# Patient Record
Sex: Male | Born: 1990 | Race: White | Hispanic: No | Marital: Single
Health system: Southern US, Community
[De-identification: ages and names within clinical notes are randomized; demographics above are authoritative.]

---

## 2019-08-08 DIAGNOSIS — I517 Cardiomegaly: Secondary | ICD-10-CM

## 2019-08-08 DIAGNOSIS — L03116 Cellulitis of left lower limb: Secondary | ICD-10-CM

## 2019-08-08 DIAGNOSIS — F1190 Opioid use, unspecified, uncomplicated: Secondary | ICD-10-CM

## 2019-08-08 DIAGNOSIS — D72829 Elevated white blood cell count, unspecified: Secondary | ICD-10-CM

## 2019-08-08 DIAGNOSIS — L03221 Cellulitis of neck: Secondary | ICD-10-CM

## 2021-01-13 ENCOUNTER — Emergency Department (HOSPITAL_COMMUNITY): Payer: Self-pay

## 2021-01-13 ENCOUNTER — Encounter (HOSPITAL_COMMUNITY): Payer: Self-pay | Admitting: *Deleted

## 2021-01-13 ENCOUNTER — Other Ambulatory Visit: Payer: Self-pay

## 2021-01-13 ENCOUNTER — Inpatient Hospital Stay (HOSPITAL_COMMUNITY)
Admission: EM | Admit: 2021-01-13 | Discharge: 2021-01-16 | DRG: 917 | Disposition: A | Discharge status: Home / Self Care | Payer: Self-pay | Attending: Internal Medicine | Admitting: Internal Medicine

## 2021-01-13 DIAGNOSIS — F1910 Other psychoactive substance abuse, uncomplicated: Secondary | ICD-10-CM | POA: Diagnosis present

## 2021-01-13 DIAGNOSIS — T40411A Poisoning by fentanyl or fentanyl analogs, accidental (unintentional), initial encounter: Secondary | ICD-10-CM | POA: Diagnosis present

## 2021-01-13 DIAGNOSIS — R0602 Shortness of breath: Secondary | ICD-10-CM

## 2021-01-13 DIAGNOSIS — E162 Hypoglycemia, unspecified: Secondary | ICD-10-CM | POA: Diagnosis present

## 2021-01-13 DIAGNOSIS — N179 Acute kidney failure, unspecified: Secondary | ICD-10-CM | POA: Diagnosis present

## 2021-01-13 DIAGNOSIS — G928 Other toxic encephalopathy: Secondary | ICD-10-CM | POA: Diagnosis present

## 2021-01-13 DIAGNOSIS — E870 Hyperosmolality and hypernatremia: Secondary | ICD-10-CM | POA: Diagnosis present

## 2021-01-13 DIAGNOSIS — U071 COVID-19: Secondary | ICD-10-CM | POA: Diagnosis present

## 2021-01-13 DIAGNOSIS — T50901A Poisoning by unspecified drugs, medicaments and biological substances, accidental (unintentional), initial encounter: Secondary | ICD-10-CM

## 2021-01-13 DIAGNOSIS — E872 Acidosis: Secondary | ICD-10-CM | POA: Diagnosis present

## 2021-01-13 DIAGNOSIS — R739 Hyperglycemia, unspecified: Secondary | ICD-10-CM | POA: Diagnosis present

## 2021-01-13 DIAGNOSIS — J9601 Acute respiratory failure with hypoxia: Secondary | ICD-10-CM | POA: Diagnosis present

## 2021-01-13 DIAGNOSIS — T401X1A Poisoning by heroin, accidental (unintentional), initial encounter: Principal | ICD-10-CM | POA: Diagnosis present

## 2021-01-13 DIAGNOSIS — R569 Unspecified convulsions: Secondary | ICD-10-CM | POA: Diagnosis present

## 2021-01-13 DIAGNOSIS — G929 Unspecified toxic encephalopathy: Secondary | ICD-10-CM | POA: Diagnosis present

## 2021-01-13 LAB — I-STAT ARTERIAL BLOOD GAS, ED
Acid-Base Excess: 0 mmol/L (ref 0.0–2.0)
Bicarbonate: 25.2 mmol/L (ref 20.0–28.0)
Calcium, Ion: 1.19 mmol/L (ref 1.15–1.40)
HCT: 39 % (ref 39.0–52.0)
Hemoglobin: 13.3 g/dL (ref 13.0–17.0)
O2 Saturation: 100 %
Patient temperature: 98.9
Potassium: 4.2 mmol/L (ref 3.5–5.1)
Sodium: 144 mmol/L (ref 135–145)
TCO2: 26 mmol/L (ref 22–32)
pCO2 arterial: 41.3 mmHg (ref 32.0–48.0)
pH, Arterial: 7.394 (ref 7.350–7.450)
pO2, Arterial: 530 mmHg — ABNORMAL HIGH (ref 83.0–108.0)

## 2021-01-13 LAB — I-STAT CHEM 8, ED
BUN: 19 mg/dL (ref 6–20)
Calcium, Ion: 1.26 mmol/L (ref 1.15–1.40)
Chloride: 109 mmol/L (ref 98–111)
Creatinine, Ser: 1 mg/dL (ref 0.61–1.24)
Glucose, Bld: 224 mg/dL — ABNORMAL HIGH (ref 70–99)
HCT: 50 % (ref 39.0–52.0)
Hemoglobin: 17 g/dL (ref 13.0–17.0)
Potassium: 5.1 mmol/L (ref 3.5–5.1)
Sodium: 147 mmol/L — ABNORMAL HIGH (ref 135–145)
TCO2: 20 mmol/L — ABNORMAL LOW (ref 22–32)

## 2021-01-13 LAB — PROCALCITONIN: Procalcitonin: <0.1 ng/mL

## 2021-01-13 LAB — HIV ANTIBODY (ROUTINE TESTING W REFLEX): HIV Screen 4th Generation wRfx: NONREACTIVE

## 2021-01-13 LAB — CBC WITH DIFFERENTIAL/PLATELET
Abs Immature Granulocytes: 0.04 10*3/uL (ref 0.00–0.07)
Basophils Absolute: 0 10*3/uL (ref 0.0–0.1)
Basophils Relative: 0 %
Eosinophils Absolute: 0 10*3/uL (ref 0.0–0.5)
Eosinophils Relative: 0 %
HCT: 41.7 % (ref 39.0–52.0)
Hemoglobin: 13.5 g/dL (ref 13.0–17.0)
Immature Granulocytes: 0 %
Lymphocytes Relative: 6 %
Lymphs Abs: 0.7 10*3/uL (ref 0.7–4.0)
MCH: 27.4 pg (ref 26.0–34.0)
MCHC: 32.4 g/dL (ref 30.0–36.0)
MCV: 84.6 fL (ref 80.0–100.0)
Monocytes Absolute: 0.7 10*3/uL (ref 0.1–1.0)
Monocytes Relative: 6 %
Neutro Abs: 10 10*3/uL — ABNORMAL HIGH (ref 1.7–7.7)
Neutrophils Relative %: 88 %
Platelets: 231 10*3/uL (ref 150–400)
RBC: 4.93 MIL/uL (ref 4.22–5.81)
RDW: 13.6 % (ref 11.5–15.5)
WBC: 11.4 10*3/uL — ABNORMAL HIGH (ref 4.0–10.5)
nRBC: 0 % (ref 0.0–0.2)

## 2021-01-13 LAB — COMPREHENSIVE METABOLIC PANEL
ALT: 24 U/L (ref 0–44)
AST: 42 U/L — ABNORMAL HIGH (ref 15–41)
Albumin: 5 g/dL (ref 3.5–5.0)
Alkaline Phosphatase: 74 U/L (ref 38–126)
Anion gap: 24 — ABNORMAL HIGH (ref 5–15)
BUN: 16 mg/dL (ref 6–20)
CO2: 19 mmol/L — ABNORMAL LOW (ref 22–32)
Calcium: 10.2 mg/dL (ref 8.9–10.3)
Chloride: 106 mmol/L (ref 98–111)
Creatinine, Ser: 1.27 mg/dL — ABNORMAL HIGH (ref 0.61–1.24)
GFR, Estimated: >60 mL/min (ref >60)
Glucose, Bld: 240 mg/dL — ABNORMAL HIGH (ref 70–99)
Potassium: 5.1 mmol/L (ref 3.5–5.1)
Sodium: 149 mmol/L — ABNORMAL HIGH (ref 135–145)
Total Bilirubin: 3 mg/dL — ABNORMAL HIGH (ref 0.3–1.2)
Total Protein: 7.7 g/dL (ref 6.5–8.1)

## 2021-01-13 LAB — RAPID URINE DRUG SCREEN, HOSP PERFORMED
Amphetamines: NOT DETECTED
Barbiturates: NOT DETECTED
Benzodiazepines: NOT DETECTED
Cocaine: NOT DETECTED
Opiates: NOT DETECTED
Tetrahydrocannabinol: NOT DETECTED

## 2021-01-13 LAB — URINALYSIS, MICROSCOPIC (REFLEX)
RBC / HPF: >50 RBC/hpf (ref 0–5)
Squamous Epithelial / HPF: NONE SEEN (ref 0–5)

## 2021-01-13 LAB — URINALYSIS, ROUTINE W REFLEX MICROSCOPIC
Bilirubin Urine: NEGATIVE
Glucose, UA: NEGATIVE mg/dL
Ketones, ur: NEGATIVE mg/dL
Leukocytes,Ua: NEGATIVE
Nitrite: NEGATIVE
Protein, ur: 30 mg/dL — AB
Specific Gravity, Urine: >1.03 — ABNORMAL HIGH (ref 1.005–1.030)
pH: 6 (ref 5.0–8.0)

## 2021-01-13 LAB — MRSA PCR SCREENING: MRSA by PCR: NEGATIVE

## 2021-01-13 LAB — MAGNESIUM: Magnesium: 2.3 mg/dL (ref 1.7–2.4)

## 2021-01-13 LAB — LACTIC ACID, PLASMA
Lactic Acid, Venous: >11 mmol/L (ref 0.5–1.9)
Lactic Acid, Venous: 1.6 mmol/L (ref 0.5–1.9)

## 2021-01-13 LAB — SALICYLATE LEVEL: Salicylate Lvl: <7 mg/dL — ABNORMAL LOW (ref 7.0–30.0)

## 2021-01-13 LAB — ACETAMINOPHEN LEVEL: Acetaminophen (Tylenol), Serum: <10 ug/mL — ABNORMAL LOW (ref 10–30)

## 2021-01-13 LAB — RESP PANEL BY RT-PCR (FLU A&B, COVID) ARPGX2
Influenza A by PCR: NEGATIVE
Influenza B by PCR: NEGATIVE
SARS Coronavirus 2 by RT PCR: POSITIVE — AB

## 2021-01-13 LAB — GLUCOSE, CAPILLARY
Glucose-Capillary: 60 mg/dL — ABNORMAL LOW (ref 70–99)
Glucose-Capillary: 91 mg/dL (ref 70–99)
Glucose-Capillary: 94 mg/dL (ref 70–99)

## 2021-01-13 LAB — PHOSPHORUS: Phosphorus: 1.2 mg/dL — ABNORMAL LOW (ref 2.5–4.6)

## 2021-01-13 LAB — HEMOGLOBIN A1C
Hgb A1c MFr Bld: 4.5 % — ABNORMAL LOW (ref 4.8–5.6)
Mean Plasma Glucose: 82.45 mg/dL

## 2021-01-13 LAB — ETHANOL: Alcohol, Ethyl (B): <10 mg/dL (ref <10)

## 2021-01-13 MED ORDER — SODIUM CHLORIDE 0.9 % IV BOLUS
1000.0000 mL | Freq: Once | INTRAVENOUS | Status: AC
  Administered 2021-01-13: 1000 mL via INTRAVENOUS

## 2021-01-13 MED ORDER — SODIUM CHLORIDE 0.9 % IV SOLN: INTRAVENOUS | Status: DC

## 2021-01-13 MED ORDER — PROPOFOL 1000 MG/100ML IV EMUL
0.0000 ug/kg/min | INTRAVENOUS | Status: DC
  Administered 2021-01-13 – 2021-01-14 (×2): 20 ug/kg/min via INTRAVENOUS
  Filled 2021-01-13 (×3): qty 100

## 2021-01-13 MED ORDER — DOCUSATE SODIUM 50 MG/5ML PO LIQD
100.0000 mg | Freq: Two times a day (BID) | ORAL | Status: DC | PRN
  Filled 2021-01-13: qty 10

## 2021-01-13 MED ORDER — THIAMINE HCL 100 MG/ML IJ SOLN
100.0000 mg | Freq: Every day | INTRAMUSCULAR | Status: DC
  Administered 2021-01-13 – 2021-01-16 (×4): 100 mg via INTRAVENOUS
  Filled 2021-01-13 (×4): qty 2

## 2021-01-13 MED ORDER — POLYETHYLENE GLYCOL 3350 17 G PO PACK
17.0000 g | PACK | Freq: Every day | ORAL | Status: DC
  Administered 2021-01-13 – 2021-01-14 (×2): 17 g

## 2021-01-13 MED ORDER — ORAL CARE MOUTH RINSE
15.0000 mL | OROMUCOSAL | Status: DC
  Administered 2021-01-13 – 2021-01-14 (×8): 15 mL via OROMUCOSAL

## 2021-01-13 MED ORDER — FOLIC ACID 5 MG/ML IJ SOLN
1.0000 mg | Freq: Every day | INTRAMUSCULAR | Status: DC
  Administered 2021-01-13 – 2021-01-14 (×2): 1 mg via INTRAVENOUS
  Filled 2021-01-13 (×5): qty 0.2

## 2021-01-13 MED ORDER — ADULT MULTIVITAMIN W/MINERALS CH
1.0000 | ORAL_TABLET | Freq: Every day | ORAL | Status: DC
  Administered 2021-01-14 – 2021-01-16 (×3): 1
  Filled 2021-01-13 (×3): qty 1

## 2021-01-13 MED ORDER — FENTANYL CITRATE (PF) 100 MCG/2ML IJ SOLN
50.0000 ug | INTRAMUSCULAR | Status: DC | PRN
  Administered 2021-01-14 (×2): 100 ug via INTRAVENOUS
  Filled 2021-01-13: qty 2

## 2021-01-13 MED ORDER — DEXMEDETOMIDINE HCL IN NACL 400 MCG/100ML IV SOLN
0.4000 ug/kg/h | INTRAVENOUS | Status: DC
  Filled 2021-01-13: qty 100

## 2021-01-13 MED ORDER — POLYETHYLENE GLYCOL 3350 17 G PO PACK: 17.0000 g | PACK | Freq: Every day | ORAL | Status: DC | PRN

## 2021-01-13 MED ORDER — FAMOTIDINE IN NACL 20-0.9 MG/50ML-% IV SOLN
20.0000 mg | Freq: Two times a day (BID) | INTRAVENOUS | Status: DC
  Administered 2021-01-13 – 2021-01-16 (×6): 20 mg via INTRAVENOUS
  Filled 2021-01-13 (×8): qty 50

## 2021-01-13 MED ORDER — LORAZEPAM 2 MG/ML IJ SOLN: 2.0000 mg | Freq: Once | INTRAMUSCULAR | Status: DC | PRN

## 2021-01-13 MED ORDER — PROPOFOL 1000 MG/100ML IV EMUL
5.0000 ug/kg/min | INTRAVENOUS | Status: DC
  Administered 2021-01-13: 10 ug/kg/min via INTRAVENOUS

## 2021-01-13 MED ORDER — LACTATED RINGERS IV BOLUS
1000.0000 mL | Freq: Once | INTRAVENOUS | Status: AC
  Administered 2021-01-13: 1000 mL via INTRAVENOUS

## 2021-01-13 MED ORDER — DEXTROSE 50 % IV SOLN
1.0000 | Freq: Once | INTRAVENOUS | Status: AC
  Administered 2021-01-13: 50 mL via INTRAVENOUS

## 2021-01-13 MED ORDER — LORAZEPAM 2 MG/ML IJ SOLN
2.0000 mg | Freq: Once | INTRAMUSCULAR | Status: AC
  Administered 2021-01-13: 2 mg via INTRAVENOUS

## 2021-01-13 MED ORDER — FENTANYL CITRATE (PF) 100 MCG/2ML IJ SOLN
50.0000 ug | INTRAMUSCULAR | Status: DC | PRN
  Filled 2021-01-13 (×2): qty 2

## 2021-01-13 MED ORDER — CHLORHEXIDINE GLUCONATE 0.12% ORAL RINSE (MEDLINE KIT)
15.0000 mL | Freq: Two times a day (BID) | OROMUCOSAL | Status: DC
  Administered 2021-01-13 – 2021-01-14 (×2): 15 mL via OROMUCOSAL

## 2021-01-13 MED ORDER — DEXTROSE 50 % IV SOLN
INTRAVENOUS | Status: AC
  Filled 2021-01-13: qty 50

## 2021-01-13 MED ORDER — DOCUSATE SODIUM 100 MG PO CAPS: 100.0000 mg | ORAL_CAPSULE | Freq: Two times a day (BID) | ORAL | Status: DC | PRN

## 2021-01-13 MED ORDER — ALBUTEROL SULFATE (2.5 MG/3ML) 0.083% IN NEBU: 2.5000 mg | INHALATION_SOLUTION | RESPIRATORY_TRACT | Status: DC | PRN

## 2021-01-13 MED ORDER — DOCUSATE SODIUM 50 MG/5ML PO LIQD
100.0000 mg | Freq: Two times a day (BID) | ORAL | Status: DC
  Administered 2021-01-13 – 2021-01-15 (×5): 100 mg
  Filled 2021-01-13 (×5): qty 10

## 2021-01-13 MED ORDER — HEPARIN SODIUM (PORCINE) 5000 UNIT/ML IJ SOLN
5000.0000 [IU] | Freq: Three times a day (TID) | INTRAMUSCULAR | Status: DC
  Administered 2021-01-13 – 2021-01-15 (×6): 5000 [IU] via SUBCUTANEOUS
  Filled 2021-01-13 (×6): qty 1

## 2021-01-13 MED ORDER — INSULIN ASPART 100 UNIT/ML ~~LOC~~ SOLN: 0.0000 [IU] | SUBCUTANEOUS | Status: DC

## 2021-01-13 MED ORDER — LACTATED RINGERS IV BOLUS: 2000.0000 mL | Freq: Once | INTRAVENOUS | Status: DC

## 2021-01-13 MED ORDER — LORAZEPAM 2 MG/ML IJ SOLN
INTRAMUSCULAR | Status: AC
  Administered 2021-01-13: 2 mg
  Filled 2021-01-13: qty 1

## 2021-01-13 NOTE — ED Notes (Signed)
Patient transported to CT 

## 2021-01-13 NOTE — H&P (Addendum)
NAME:  James Li, MRN:  [id number], DOB:  01-Jun-1991, LOS: 0 ADMISSION DATE:  01/13/2021, CONSULTATION DATE:  01/13/2021 REFERRING MD:  Dr. Particia Nearing, CHIEF COMPLAINT:  OD  Brief History:  89 yoM with prior history of polysubstance abuse who had been clean for 6 months but relapsed and reportedly accidentally overdosed on heroin.  Not responsive to multiple doses of narcan and questionable seizure activity requiring intubation in ER.  PCCM called for admit.   History of Present Illness:  Obtained from medical chart review as patient is continued sedated on mechanical ventilation.  30 year old male with history of polysubstance abuse, reportedly clean for the last 6 months,  presenting from hotel room with altered mental status after heroin use.  Girlfriend called EMS and gave narcan.  EMS found patient agonal, and responsive to 4 mg of Narcan, and required BVM bagging in route to ER.  On arrival to ER, patient more hypoxic wiith questionable seizure activity was given additional Ativan and Narcan without improvement and therefore intubated for airway protection.  Labs noted for negative UDS, ETOH, salicylate and Tylenol level, Na 149, K 5.1, glucose 240, sCr 1.27, AST 42, lactic acid > 11, WBC 11.4, CXR non acute, lines stable and CTH negative.  Found to be incidentally Covid positive. PCCM called for admit.   Past Medical History:  Polysubstance/ IVDA  Significant Hospital Events:  2/13 admitted/ intubated  Consults:    Procedures:  ETT 2/13 >>  Significant Diagnostic Tests:  2/13 CTH >> Brain parenchyma appears unremarkable. No mass or hemorrhage. There is mucosal thickening in several ethmoid air cells.  Micro Data:  2/13 SARS >> positive  Flu>> neg  Antimicrobials:  n/a  Interim History / Subjective:    Objective   Blood pressure (!) 97/56, pulse 77, temperature 98.8 F (37.1 C), resp. rate 16, height 6' (1.829 m), weight 99.8 kg, SpO2 100 %.    Vent  Mode: PRVC FiO2 (%):  [100 %] 100 % Set Rate:  [16 bmp] 16 bmp Vt Set:  [299 mL] 620 mL PEEP:  [5 cmH20] 5 cmH20 Plateau Pressure:  [15 cmH20] 15 cmH20   Intake/Output Summary (Last 24 hours) at 01/13/2021 1548 Last data filed at 01/13/2021 1531 Gross per 24 hour  Intake 11.77 ml  Output --  Net 11.77 ml   Filed Weights   01/13/21 1400 01/13/21 1414  Weight: 99.8 kg 99.8 kg    Examination: on propofol 10 mcg/kg/min General:  Young adult male intubated on MV in NAD HEENT: MM pink/moist, ETT/ OGT, pupils 2/ sluggish, small abrasion over right eyebrow Neuro: sedated, not f/c, withdrawals to pain to noxious stimuli CV: rr. NSR, no murmur PULM:  Non labored, CTA GI: soft, +bs, foley - cyu  Extremities: warm/dry, no edema  Skin: no rashes, several healed scars to chest, tattoos, no splinter hemorrhages/ janeway lesions  Resolved Hospital Problem list    Assessment & Plan:   Accidental heroin overdose Possible seizure activity, suspected 2/2 to hypoxia  Neg ETOH, UDS, tylenol and ASA levels on admit CTH neg - supportive care  - EEG - seizure precautions/ ativan prn seizure - empiric thiamine/ folate/ MVI - serial neuro exams - correct metabolic derangements  - once extubated will need to confirm w/pt accidental OD, rule out SI - checking HIV/ RPR - illicit drug abuse counseling/ CSW when appropriate  - Maintain neuro protective measures; goal for eurothermia, euglycemia, eunatermia, normoxia, and PCO2 goal of 35-40  Acute respiratory insufficiency -Continue MV  support, 4-8cc/kg IBW with goal Pplat <30 and DP<15  -VAP prevention protocol/ PPI -PAD protocol for sedation> d/c propofol, change to precedex, prn fentanyl -wean FiO2 as able for SpO2 >92%  -daily SAT & SBT when mental status improves  - intermittent CXR/ ABG  Lactic acidosis AGMA Hypernatremia AKI - s/p 2L NS in ER, additional 1LR now - trend lactic- suspected due to seizure - Trend BMP / mag/ phos/   urinary output- continue foley for now, d/c when mental status improves - Replace electrolytes as indicated - Avoid nephrotoxic agents, ensure adequate renal perfusion  Incidental COVID positive - supportive care - airborne/ contact precautions - no steroids (not hypoxic currently down to 40%)   Hyperglycemia  - CBG q 4/ SSI sensitive   Leukocytosis - likely just reactive, aspiration possible but CXR clear  - pan-cultured, UA pending, checking PCT - monitor clinically for now  Best practice (evaluated daily)  Diet: NPO Pain/Anxiety/Delirium protocol (if indicated): precedex/ prn fentanyl  VAP protocol (if indicated): yes DVT prophylaxis: SCDs, heparin SQ GI prophylaxis: pepcid  Glucose control: CBG/ SSI Mobility: BR Disposition: admit to ICU  Goals of Care:  Last date of multidisciplinary goals of care discussion: unable Family and staff present: unable Summary of discussion: unable Follow up goals of care discussion due: unable Code Status: Full   No family listed.  Will place CSW consult.    Labs   CBC: Recent Labs  Lab 01/13/21 1411 01/13/21 1534 01/13/21 1539  WBC  --  11.4*  --   NEUTROABS  --  10.0*  --   HGB 17.0 13.5 13.3  HCT 50.0 41.7 39.0  MCV  --  84.6  --   PLT  --  231  --     Basic Metabolic Panel: Recent Labs  Lab 01/13/21 1409 01/13/21 1411 01/13/21 1539  NA 149* 147* 144  K 5.1 5.1 4.2  CL 106 109  --   CO2 19*  --   --   GLUCOSE 240* 224*  --   BUN 16 19  --   CREATININE 1.27* 1.00  --   CALCIUM 10.2  --   --    GFR: Estimated Creatinine Clearance: 135.8 mL/min (by C-G formula based on SCr of 1 mg/dL). Recent Labs  Lab 01/13/21 1410 01/13/21 1534  WBC  --  11.4*  LATICACIDVEN >11.0*  --     Liver Function Tests: Recent Labs  Lab 01/13/21 1409  AST 42*  ALT 24  ALKPHOS 74  BILITOT 3.0*  PROT 7.7  ALBUMIN 5.0   No results for input(s): LIPASE, AMYLASE in the last 168 hours. No results for input(s): AMMONIA in the  last 168 hours.  ABG    Component Value Date/Time   PHART 7.394 01/13/2021 1539   PCO2ART 41.3 01/13/2021 1539   PO2ART 530 (H) 01/13/2021 1539   HCO3 25.2 01/13/2021 1539   TCO2 26 01/13/2021 1539   O2SAT 100.0 01/13/2021 1539     Coagulation Profile: No results for input(s): INR, PROTIME in the last 168 hours.  Cardiac Enzymes: No results for input(s): CKTOTAL, CKMB, CKMBINDEX, TROPONINI in the last 168 hours.  HbA1C: No results found for: HGBA1C  CBG: No results for input(s): GLUCAP in the last 168 hours.  Review of Systems:   Unable   Past Medical History:  He,  has no past medical history on file.   Surgical History:  unable  Social History:   reports current drug use. Drug: IV.  Family History:  His family history is not on file.   Allergies Not on File   Home Medications  Prior to Admission medications   Not on File     Critical care time: 55 mins     Posey Boyer, ACNP [facility] 01/13/2021, 5:18 PM

## 2021-01-13 NOTE — ED Triage Notes (Signed)
Patient presents to ed via [facility] was called to hotel room by patients girlfriend, states patient had been clean for 6 months , patient was unresp, upon ems arrival with agonal resp. Patient had NPA and was being bagged. Was given Narcan 4 mg IV per ems upon arrival to treatment room patient started seizing. DR Particia Nearing at bedside resp at bedside .

## 2021-01-13 NOTE — ED Notes (Signed)
Patient was given Narcan 2 mg IV  1402 Ativan 2 mg IV  1403 Etomidate 20 mg and Succ. 100 mg given intubated with 71/2  24 @the  lip #16 OG inserted . 

## 2021-01-13 NOTE — Progress Notes (Signed)
Assisted MD with intubation.  Pt tolerated well.  SP{O2 98% ETT 7.5/24 lip.  ABG to follow RT to monitor

## 2021-01-13 NOTE — ED Provider Notes (Signed)
INTUBATION Performed by: Linwood Dibbles  Required items: required blood products, implants, devices, and special equipment available Patient identity confirmed: provided demographic data and hospital-assigned identification number Time out: Immediately prior to procedure a "time out" was called to verify the correct patient, procedure, equipment, support staff and site/side marked as required.  Indications: Respiratory failure, Overdose  Intubation method: Glidescope Laryngoscopy   Preoxygenation: BVM  Sedatives: Etomidate Paralytic: Succinylcholine  Tube Size: 7.5 cuffed  Post-procedure assessment: chest rise and ETCO2 monitor Breath sounds: equal and absent over the epigastrium Tube secured with: ETT holder  Chest x-ray interpreted by radiologist and me.  Chest x-ray findings: endotracheal tube in appropriate position  Patient tolerated the procedure well with no immediate complications.     James Li A, PA-C 01/13/21 1441    James Lefevre, MD 01/13/21 1459

## 2021-01-13 NOTE — ED Provider Notes (Signed)
[facility] EMERGENCY DEPARTMENT Provider Note   CSN: [id number] Arrival date & time: 01/13/21  1402     History Chief Complaint  Patient presents with  . Drug Overdose    James Li is a 30 y.o. male.  Pt presents to the ED today as a drug overdose.  Pt has a hx of polysubstance abuse, but has been apparently clean for 6 months.  This afternoon, he injected heroin and became unresponsive.  His girlfriend was with him and called EMS.  Pt was having agonal respirations.  He was given 4 mg narcan IV en route and required continuous bvm.  Pt appeared to have seizure activity upon arrival to the room.  Pt unable to give any hx.       Pmhx:  Polysubstance abuse   No family history on file.  Social History   Tobacco Use  . Smoking status: Unknown If Ever Smoked  Substance Use Topics  . Drug use: Yes    Types: IV    Comment: herion    Home Medications Prior to Admission medications   Not on File    Allergies    Patient has no allergy information on record.  Review of Systems   Review of Systems  Unable to perform ROS: Patient unresponsive    Physical Exam Updated Vital Signs BP (!) 97/56   Pulse 77   Temp 98.8 F (37.1 C)   Resp 16   Ht 6' (1.829 m)   Wt 99.8 kg   SpO2 100%   BMI 29.84 kg/m   Physical Exam Vitals and nursing note reviewed.  Constitutional:      General: He is in acute distress.     Appearance: He is ill-appearing and toxic-appearing.  HENT:     Head: Normocephalic and atraumatic.     Comments: npa in place    Right Ear: External ear normal.     Left Ear: External ear normal.     Mouth/Throat:     Mouth: Mucous membranes are dry.  Eyes:     Conjunctiva/sclera: Conjunctivae normal.  Cardiovascular:     Rate and Rhythm: Regular rhythm. Tachycardia present.     Pulses: Normal pulses.     Heart sounds: Normal heart sounds.  Pulmonary:     Comments: Some spontaneous respirations.  BMV. Abdominal:      General: Abdomen is flat. Bowel sounds are normal.     Palpations: Abdomen is soft.  Musculoskeletal:        General: No deformity.     Cervical back: Normal range of motion and neck supple.  Skin:    General: Skin is warm.     Capillary Refill: Capillary refill takes less than 2 seconds.  Neurological:     Mental Status: He is unresponsive.     Comments: Seizure like activity  Psychiatric:     Comments: Unable to assess     ED Results / Procedures / Treatments   Labs (all labs ordered are listed, but only abnormal results are displayed) Labs Reviewed  RESP PANEL BY RT-PCR (FLU A&B, COVID) ARPGX2 - Abnormal; Notable for the following components:      Result Value   SARS Coronavirus 2 by RT PCR POSITIVE (*)    All other components within normal limits  COMPREHENSIVE METABOLIC PANEL - Abnormal; Notable for the following components:   Sodium 149 (*)    CO2 19 (*)    Glucose, Bld 240 (*)    Creatinine,  Ser 1.27 (*)    AST 42 (*)    Total Bilirubin 3.0 (*)    Anion gap 24 (*)    All other components within normal limits  SALICYLATE LEVEL - Abnormal; Notable for the following components:   Salicylate Lvl <7.0 (*)    All other components within normal limits  ACETAMINOPHEN LEVEL - Abnormal; Notable for the following components:   Acetaminophen (Tylenol), Serum <10 (*)    All other components within normal limits  LACTIC ACID, PLASMA - Abnormal; Notable for the following components:   Lactic Acid, Venous >11.0 (*)    All other components within normal limits  CBC WITH DIFFERENTIAL/PLATELET - Abnormal; Notable for the following components:   WBC 11.4 (*)    Neutro Abs 10.0 (*)    All other components within normal limits  I-STAT CHEM 8, ED - Abnormal; Notable for the following components:   Sodium 147 (*)    Glucose, Bld 224 (*)    TCO2 20 (*)    All other components within normal limits  I-STAT ARTERIAL BLOOD GAS, ED - Abnormal; Notable for the following components:    pO2, Arterial 530 (*)    All other components within normal limits  ETHANOL  RAPID URINE DRUG SCREEN, HOSP PERFORMED  CBC WITH DIFFERENTIAL/PLATELET  LACTIC ACID, PLASMA  BLOOD GAS, ARTERIAL  CBG MONITORING, ED    EKG None  Radiology CT Head Wo Contrast  Result Date: 01/13/2021 CLINICAL DATA:  Seizure EXAM: CT HEAD WITHOUT CONTRAST TECHNIQUE: Contiguous axial images were obtained from the base of the skull through the vertex without intravenous contrast. COMPARISON:  None. FINDINGS: Brain: Ventricles and sulci are normal in size and configuration. There is no intracranial mass, hemorrhage, extra-axial fluid collection, or midline shift. The brain parenchyma appears unremarkable. There is no evident acute infarct. Vascular: There is no hyperdense vessel. No evident vascular calcification. Skull: Bony calvarium appears intact. Sinuses/Orbits: Mucosal thickening noted in several ethmoid air cells. Orbits appear symmetric bilaterally. Other: Mastoid air cells are clear. IMPRESSION: Brain parenchyma appears unremarkable. No mass or hemorrhage. There is mucosal thickening in several ethmoid air cells. Electronically Signed   By: Bretta Bang III M.D.   On: 01/13/2021 15:37   DG Chest Portable 1 View  Result Date: 01/13/2021 CLINICAL DATA:  Status post intubation. EXAM: PORTABLE CHEST 1 VIEW COMPARISON:  Chest x-ray dated January 04, 2017. FINDINGS: Endotracheal tube tip 4.7 cm above the carina. Enteric tube in the stomach with the tip in the gastric fundus. The heart size and mediastinal contours are within normal limits. Normal pulmonary vascularity. No focal consolidation, pleural effusion, or pneumothorax. No acute osseous abnormality. IMPRESSION: 1. Appropriately positioned endotracheal and enteric tubes. 2. No active disease. Electronically Signed   By: Obie Dredge M.D.   On: 01/13/2021 14:36    Procedures Procedures   Medications Ordered in ED Medications  propofol (DIPRIVAN)  1000 MG/100ML infusion (15 mcg/kg/min  99.8 kg Intravenous Infusion Verify 01/13/21 1531)  sodium chloride 0.9 % bolus 1,000 mL (0 mLs Intravenous Stopped 01/13/21 1558)    And  0.9 %  sodium chloride infusion ( Intravenous New Bag/Given 01/13/21 1437)  LORazepam (ATIVAN) injection 2 mg (2 mg Intravenous Given 01/13/21 1416)  LORazepam (ATIVAN) 2 MG/ML injection (2 mg  Given 01/13/21 1435)  sodium chloride 0.9 % bolus 1,000 mL (0 mLs Intravenous Stopped 01/13/21 1558)    ED Course  I have reviewed the triage vital signs and the nursing notes.  Pertinent labs &  imaging results that were available during my care of the patient were reviewed by me and considered in my medical decision making (see chart for details).    MDM Rules/Calculators/A&P                          Pt given an additional 2 mg narcan IV and 2 mg of ativan IV.  Seizure appeared to stop, but pt is still minimally responsive.  Decision made to intubate.  Pt intubated without difficulty.  O2 sat did drop briefly during intubation, but it came right back up.   Pt d/w CCM for admission.  Pt is also incidentally positive for Covid.  CRITICAL CARE Performed by: Jacalyn Lefevre   Total critical care time: 45 minutes  Critical care time was exclusive of separately billable procedures and treating other patients.  Critical care was necessary to treat or prevent imminent or life-threatening deterioration.  Critical care was time spent personally by me on the following activities: development of treatment plan with patient and/or surrogate as well as nursing, discussions with consultants, evaluation of patient's response to treatment, examination of patient, obtaining history from patient or surrogate, ordering and performing treatments and interventions, ordering and review of laboratory studies, ordering and review of radiographic studies, pulse oximetry and re-evaluation of patient's condition.   Final Clinical Impression(s) /  ED Diagnoses Final diagnoses:  Accidental overdose of heroin, initial encounter (HCC)  Acute respiratory failure with hypoxia (HCC)  COVID-19    Rx / DC Orders ED Discharge Orders    None       Jacalyn Lefevre, MD 01/13/21 1559

## 2021-01-14 DIAGNOSIS — T401X1A Poisoning by heroin, accidental (unintentional), initial encounter: Principal | ICD-10-CM

## 2021-01-14 DIAGNOSIS — G929 Unspecified toxic encephalopathy: Secondary | ICD-10-CM

## 2021-01-14 DIAGNOSIS — U071 COVID-19: Secondary | ICD-10-CM

## 2021-01-14 LAB — BASIC METABOLIC PANEL
Anion gap: 8 (ref 5–15)
BUN: 14 mg/dL (ref 6–20)
CO2: 21 mmol/L — ABNORMAL LOW (ref 22–32)
Calcium: 8 mg/dL — ABNORMAL LOW (ref 8.9–10.3)
Chloride: 113 mmol/L — ABNORMAL HIGH (ref 98–111)
Creatinine, Ser: 0.96 mg/dL (ref 0.61–1.24)
GFR, Estimated: >60 mL/min (ref >60)
Glucose, Bld: 101 mg/dL — ABNORMAL HIGH (ref 70–99)
Potassium: 3.3 mmol/L — ABNORMAL LOW (ref 3.5–5.1)
Sodium: 142 mmol/L (ref 135–145)

## 2021-01-14 LAB — CBC
HCT: 35.6 % — ABNORMAL LOW (ref 39.0–52.0)
Hemoglobin: 12.3 g/dL — ABNORMAL LOW (ref 13.0–17.0)
MCH: 28.7 pg (ref 26.0–34.0)
MCHC: 34.6 g/dL (ref 30.0–36.0)
MCV: 83.2 fL (ref 80.0–100.0)
Platelets: 181 10*3/uL (ref 150–400)
RBC: 4.28 MIL/uL (ref 4.22–5.81)
RDW: 14 % (ref 11.5–15.5)
WBC: 7 10*3/uL (ref 4.0–10.5)
nRBC: 0 % (ref 0.0–0.2)

## 2021-01-14 LAB — PHOSPHORUS: Phosphorus: 2.8 mg/dL (ref 2.5–4.6)

## 2021-01-14 LAB — HEPATIC FUNCTION PANEL
ALT: 18 U/L (ref 0–44)
AST: 32 U/L (ref 15–41)
Albumin: 3.3 g/dL — ABNORMAL LOW (ref 3.5–5.0)
Alkaline Phosphatase: 46 U/L (ref 38–126)
Bilirubin, Direct: 0.2 mg/dL (ref 0.0–0.2)
Indirect Bilirubin: 3 mg/dL — ABNORMAL HIGH (ref 0.3–0.9)
Total Bilirubin: 3.2 mg/dL — ABNORMAL HIGH (ref 0.3–1.2)
Total Protein: 5.2 g/dL — ABNORMAL LOW (ref 6.5–8.1)

## 2021-01-14 LAB — GLUCOSE, CAPILLARY
Glucose-Capillary: 103 mg/dL — ABNORMAL HIGH (ref 70–99)
Glucose-Capillary: 125 mg/dL — ABNORMAL HIGH (ref 70–99)
Glucose-Capillary: 160 mg/dL — ABNORMAL HIGH (ref 70–99)
Glucose-Capillary: 224 mg/dL — ABNORMAL HIGH (ref 70–99)
Glucose-Capillary: 58 mg/dL — ABNORMAL LOW (ref 70–99)
Glucose-Capillary: 64 mg/dL — ABNORMAL LOW (ref 70–99)
Glucose-Capillary: 68 mg/dL — ABNORMAL LOW (ref 70–99)
Glucose-Capillary: 82 mg/dL (ref 70–99)
Glucose-Capillary: 83 mg/dL (ref 70–99)

## 2021-01-14 LAB — RPR: RPR Ser Ql: NONREACTIVE

## 2021-01-14 LAB — MAGNESIUM: Magnesium: 1.9 mg/dL (ref 1.7–2.4)

## 2021-01-14 LAB — TRIGLYCERIDES: Triglycerides: 92 mg/dL (ref <150)

## 2021-01-14 MED ORDER — ONDANSETRON HCL 4 MG/2ML IJ SOLN
INTRAMUSCULAR | Status: AC
  Administered 2021-01-14: 4 mg via INTRAVENOUS
  Filled 2021-01-14: qty 2

## 2021-01-14 MED ORDER — NALOXONE HCL 0.4 MG/ML IJ SOLN
INTRAMUSCULAR | Status: AC
  Filled 2021-01-14: qty 1

## 2021-01-14 MED ORDER — NALOXONE HCL 0.4 MG/ML IJ SOLN: 0.1000 mg | INTRAMUSCULAR | Status: DC | PRN

## 2021-01-14 MED ORDER — MELATONIN 5 MG PO TABS
5.0000 mg | ORAL_TABLET | Freq: Once | ORAL | Status: AC
  Administered 2021-01-14: 5 mg via ORAL
  Filled 2021-01-14 (×2): qty 1

## 2021-01-14 MED ORDER — MAGNESIUM SULFATE 2 GM/50ML IV SOLN
2.0000 g | Freq: Once | INTRAVENOUS | Status: AC
  Administered 2021-01-14: 2 g via INTRAVENOUS
  Filled 2021-01-14: qty 50

## 2021-01-14 MED ORDER — NALOXONE HCL 0.4 MG/ML IJ SOLN
0.1000 mg | INTRAMUSCULAR | Status: DC | PRN
  Administered 2021-01-14: 0.4 mg via INTRAVENOUS

## 2021-01-14 MED ORDER — DEXTROSE 50 % IV SOLN: 1.0000 | Freq: Once | INTRAVENOUS | Status: AC

## 2021-01-14 MED ORDER — DEXTROSE 50 % IV SOLN
12.5000 g | INTRAVENOUS | Status: AC
  Administered 2021-01-14: 12.5 g via INTRAVENOUS
  Filled 2021-01-14: qty 50

## 2021-01-14 MED ORDER — IBUPROFEN 400 MG PO TABS
400.0000 mg | ORAL_TABLET | Freq: Once | ORAL | Status: AC
  Administered 2021-01-14: 400 mg via ORAL
  Filled 2021-01-14: qty 1

## 2021-01-14 MED ORDER — INFLUENZA VAC SPLIT QUAD 0.5 ML IM SUSY: 0.5000 mL | PREFILLED_SYRINGE | INTRAMUSCULAR | Status: DC

## 2021-01-14 MED ORDER — POTASSIUM CHLORIDE 10 MEQ/100ML IV SOLN
10.0000 meq | INTRAVENOUS | Status: AC
  Administered 2021-01-14 (×2): 10 meq via INTRAVENOUS
  Filled 2021-01-14 (×2): qty 100

## 2021-01-14 MED ORDER — DEXTROSE 50 % IV SOLN
INTRAVENOUS | Status: AC
  Administered 2021-01-14: 50 mL via INTRAVENOUS
  Filled 2021-01-14: qty 50

## 2021-01-14 MED ORDER — DOCUSATE SODIUM 100 MG PO CAPS
100.0000 mg | ORAL_CAPSULE | Freq: Once | ORAL | Status: AC
  Administered 2021-01-14: 100 mg via ORAL
  Filled 2021-01-14: qty 1

## 2021-01-14 MED ORDER — CHLORHEXIDINE GLUCONATE CLOTH 2 % EX PADS
6.0000 | MEDICATED_PAD | Freq: Every day | CUTANEOUS | Status: DC
  Administered 2021-01-14 – 2021-01-15 (×2): 6 via TOPICAL

## 2021-01-14 MED ORDER — MELATONIN 5 MG PO TABS
5.0000 mg | ORAL_TABLET | Freq: Every evening | ORAL | Status: DC | PRN
  Administered 2021-01-14 – 2021-01-15 (×2): 5 mg via ORAL
  Filled 2021-01-14 (×3): qty 1

## 2021-01-14 MED ORDER — ACETAMINOPHEN 325 MG PO TABS
650.0000 mg | ORAL_TABLET | Freq: Four times a day (QID) | ORAL | Status: DC | PRN
  Administered 2021-01-14 – 2021-01-15 (×2): 650 mg via ORAL
  Filled 2021-01-14 (×2): qty 2

## 2021-01-14 MED ORDER — ONDANSETRON HCL 4 MG/2ML IJ SOLN
4.0000 mg | Freq: Four times a day (QID) | INTRAMUSCULAR | Status: DC | PRN
  Administered 2021-01-14 – 2021-01-15 (×2): 4 mg via INTRAVENOUS
  Filled 2021-01-14 (×2): qty 2

## 2021-01-14 NOTE — Progress Notes (Signed)
eLink Physician-Brief Progress Note Patient Name: James Li DOB: [birth year] MRN: [id number]   Date of Service  01/14/2021  HPI/Events of Note  Patient c/o generalized pain. Patient admitted with accidental Heroin OD. Patient also requests sleep aid.   eICU Interventions  Plan: 1. Motrin 400 mg PO X 1 now.  2. Melatonin 5 mg PO Q HS PRN sleep.      Intervention Category Major Interventions: Other:  Lenell Antu 01/14/2021, 8:53 PM

## 2021-01-14 NOTE — Evaluation (Signed)
Physical Therapy Evaluation Patient Details Name: James Li MRN: [id number] DOB: [birth year] Today's Date: 01/14/2021   History of Present Illness  30 y.o. man with history of drug use. Reportedly none in the last 6 months but used heroin prior to admission. Was given narcan by his girlfriend and by EMS. Had witnessed GTC seizure and was intubated for airway protection. incidentally covid +  Clinical Impression   Pt admitted with above diagnosis.  Pt currently with functional limitations due to the deficits listed below (see PT Problem List). Patient slow to respond, however demonstrates normal strength. Not oriented to time or situation. Anticipate will not need follow-up PT, however will follow acutely to further challenge mobility in functional context and with cognitive challenges.     Follow Up Recommendations No PT follow up    Equipment Recommendations  None recommended by PT    Recommendations for Other Services Speech consult;OT consult     Precautions / Restrictions        Mobility  Bed Mobility Overal bed mobility: Independent             General bed mobility comments: standby for lines only    Transfers Overall transfer level: Needs assistance Equipment used: None Transfers: Sit to/from Stand Sit to Stand: Supervision         General transfer comment: for safety and lines  Ambulation/Gait Ambulation/Gait assistance: Min guard Gait Distance (Feet): 3 Feet Assistive device: None Gait Pattern/deviations: Step-through pattern;Decreased stride length     General Gait Details: bed to chair only  Stairs            Wheelchair Mobility    Modified Rankin (Stroke Patients Only)       Balance Overall balance assessment: No apparent balance deficits (not formally assessed)                                           Pertinent Vitals/Pain Pain Assessment: Faces Faces Pain Scale: No hurt    Home Living                    Additional Comments: pt with weak voice and confused; unable to assess    Prior Function Level of Independence: Independent               Hand Dominance        Extremity/Trunk Assessment   Upper Extremity Assessment Upper Extremity Assessment: Overall WFL for tasks assessed (LUE with edema)    Lower Extremity Assessment Lower Extremity Assessment: Overall WFL for tasks assessed    Cervical / Trunk Assessment Cervical / Trunk Assessment: Normal  Communication   Communication: Expressive difficulties (weak voice post-extubation)  Cognition Arousal/Alertness: Lethargic Behavior During Therapy: Flat affect Overall Cognitive Status: Impaired/Different from baseline Area of Impairment: Orientation;Memory                 Orientation Level: Time;Situation (could not state year (month with cues); surprised when told he overdosed (RN confirmed he's been told multiple times and always is surprised)   Memory: Decreased short-term memory                General Comments General comments (skin integrity, edema, etc.): pt slow to respond--especially after reminded him why he is hospitalized    Exercises     Assessment/Plan    PT Assessment Patient needs continued PT services  PT Problem List Decreased cognition       PT Treatment Interventions Gait training;Cognitive remediation;Patient/family education    PT Goals (Current goals can be found in the Care Plan section)  Acute Rehab PT Goals Patient Stated Goal: unable/unwilling PT Goal Formulation: With patient Time For Goal Achievement: 01/28/21 Potential to Achieve Goals: Good    Frequency Min 4X/week   Barriers to discharge        Co-evaluation               AM-PAC PT "6 Clicks" Mobility  Outcome Measure Help needed turning from your back to your side while in a flat bed without using bedrails?: None Help needed moving from lying on your back to sitting on the side of a  flat bed without using bedrails?: None Help needed moving to and from a bed to a chair (including a wheelchair)?: A Little Help needed standing up from a chair using your arms (e.g., wheelchair or bedside chair)?: None Help needed to walk in hospital room?: A Little Help needed climbing 3-5 steps with a railing? : A Little 6 Click Score: 21    End of Session Equipment Utilized During Treatment: Gait belt Activity Tolerance: Patient tolerated treatment well Patient left: in chair;with call bell/phone within reach Nurse Communication: Mobility status;Other (comment) (decr memory for what he's been told) PT Visit Diagnosis: Other symptoms and signs involving the nervous system (R29.898)    Time: 0938-1829 PT Time Calculation (min) (ACUTE ONLY): 26 min   Charges:   PT Evaluation $PT Eval Low Complexity: 1 Low PT Treatments $Therapeutic Activity: 8-22 mins         James Li, PT Pager [phone]   Zena Amos 01/14/2021, 4:11 PM

## 2021-01-14 NOTE — Procedures (Signed)
Extubation Procedure Note  Patient Details:   Name: James Li DOB: 1991-08-25 MRN: [id number]   Airway Documentation:    Vent end date: 01/14/21 Vent end time: 1010   Evaluation  O2 sats: stable throughout Complications: No apparent complications Patient did tolerate procedure well. Bilateral Breath Sounds: Clear,Diminished   Yes   Pt extubated to 2L [place] per MD order. Positive cuff leak noted prior to extubation. Pt not able to speak or cough directly post extubation. Pt given narcan and improved significantly and was able to speak. Pt encouraged to cough and use [place] to clear secretions.   [name] 01/14/2021, 10:22 AM

## 2021-01-14 NOTE — Progress Notes (Signed)
NAME:  James Li, MRN:  [id number], DOB:  [birth year], LOS: 1 ADMISSION DATE:  01/13/2021, CONSULTATION DATE:  01/13/2021 REFERRING MD:  Dr. Particia Nearing, CHIEF COMPLAINT:  OD  Brief History:  30 yoM with prior history of polysubstance abuse who had been clean for 6 months but relapsed and reportedly accidentally overdosed on heroin.  Not responsive to multiple doses of narcan and questionable seizure activity requiring intubation in ER.  PCCM called for admit.   History of Present Illness:  Obtained from medical chart review as patient is continued sedated on mechanical ventilation.  30 year old male with history of polysubstance abuse, reportedly clean for the last 6 months,  presenting from hotel room with altered mental status after heroin use.  Girlfriend called EMS and gave narcan.  EMS found patient agonal, and responsive to 4 mg of Narcan, and required BVM bagging in route to ER.  On arrival to ER, patient more hypoxic wiith questionable seizure activity was given additional Ativan and Narcan without improvement and therefore intubated for airway protection.  Labs noted for negative UDS, ETOH, salicylate and Tylenol level, Na 149, K 5.1, glucose 240, sCr 1.27, AST 42, lactic acid > 11, WBC 11.4, CXR non acute, lines stable and CTH negative.  Found to be incidentally Covid positive. PCCM called for admit.   Past Medical History:  Polysubstance/ IVDA  Significant Hospital Events:  2/13 admitted/ intubated  Consults:    Procedures:  ETT 2/13 >2/14  Significant Diagnostic Tests:  2/13 CTH >> Brain parenchyma appears unremarkable. No mass or hemorrhage. There is mucosal thickening in several ethmoid air cells.  Micro Data:  2/13 SARS >> positive, Flu>> neg  Antimicrobials:  n/a  Interim History / Subjective:  SBT with Vt 1x emesis prior to extubation   Objective   Blood pressure (!) 96/59, pulse (!) 55, temperature 98.6 F (37 C), temperature source Oral,  resp. rate 16, height 6' (1.829 m), weight 99.8 kg, SpO2 100 %.    Vent Mode: PRVC FiO2 (%):  [40 %-100 %] 40 % Set Rate:  [16 bmp] 16 bmp Vt Set:  [620 mL] 620 mL PEEP:  [5 cmH20] 5 cmH20 Plateau Pressure:  [15 cmH20-17 cmH20] 17 cmH20   Intake/Output Summary (Last 24 hours) at 01/14/2021 0709 Last data filed at 01/14/2021 0400 Gross per 24 hour  Intake 1930.14 ml  Output 525 ml  Net 1405.14 ml   Filed Weights   01/13/21 1400 01/13/21 1414  Weight: 99.8 kg 99.8 kg    Examination:  General:  wdwn young adult M, seated in bed moving.  HEENT: NCAT pink mm ETT secure  Neuro: Awakens spontaneously, moves BUE BLE. Follows commands.  CV: rrr s1s2 no rgm  PULM:  Symmetrical chest expansion, e ven, unlabored. CTAb Mechanically ventilated  GI: soft round ndnt  GU: foley with pink tinged urine  Extremities:  No obvious deformity, no cyanosis or clubbing. BUE soft restraints  Skin: chest with several healed scars. Tattoos. C/d/w no rash   Resolved Hospital Problem list    Hypernatremia AKI AGMA Leukocytosis   Assessment & Plan:   Accidental Fentanyl overdose  Substance abuse disorder Possible seizure activity, suspected 2/2 to hypoxia  Neg ETOH, UDS, tylenol and ASA levels on admit CTH neg P -PRN narcan  - empiric thiamine/ folate/ MVI - serial neuro exams -TOC  -substance cessation counseling  Acute encephalopathy , toxic metabolic  -drug overdose as above -hypoglycemia P -Correct Glu as able -PRN narcan  -delirium precautions  -  correct metabolic derangements   Acute respiratory failure in setting of poor airway protection due to fentanyl overdose  P -Tolerating SBT -Extubate -IS, pulm hygiene    Incidental COVID positive - supportive care - airborne/ contact precautions - no steroids (not hypoxic currently down to 40%)   Hypoglycemia P -PRN correction -hope to advance diet later in day following extubation    Best practice (evaluated daily)  Diet:  NPO Pain/Anxiety/Delirium protocol (if indicated): n/a. PRN narcan  VAP protocol (if indicated): yes DVT prophylaxis: SCDs, heparin SQ GI prophylaxis: pepcid  Glucose control: CBG Mobility: BR Disposition: ICU   Goals of Care:  Last date of multidisciplinary goals of care discussion: -- Family and staff present: unable Summary of discussion: unable Follow up goals of care discussion due: 2/20 Code Status: Full  No family listed.  Pt updated 2/14  Labs   CBC: Recent Labs  Lab 01/13/21 1411 01/13/21 1534 01/13/21 1539 01/14/21 0521  WBC  --  11.4*  --  7.0  NEUTROABS  --  10.0*  --   --   HGB 17.0 13.5 13.3 12.3*  HCT 50.0 41.7 39.0 35.6*  MCV  --  84.6  --  83.2  PLT  --  231  --  181    Basic Metabolic Panel: Recent Labs  Lab 01/13/21 1409 01/13/21 1411 01/13/21 1539 01/13/21 1702 01/14/21 0521  NA 149* 147* 144  --  142  K 5.1 5.1 4.2  --  3.3*  CL 106 109  --   --  113*  CO2 19*  --   --   --  21*  GLUCOSE 240* 224*  --   --  101*  BUN 16 19  --   --  14  CREATININE 1.27* 1.00  --   --  0.96  CALCIUM 10.2  --   --   --  8.0*  MG  --   --   --  2.3 1.9  PHOS  --   --   --  1.2* 2.8   GFR: Estimated Creatinine Clearance: 141.4 mL/min (by C-G formula based on SCr of 0.96 mg/dL). Recent Labs  Lab 01/13/21 1410 01/13/21 1534 01/13/21 1702 01/14/21 0521  PROCALCITON  --   --  <0.10  --   WBC  --  11.4*  --  7.0  LATICACIDVEN >11.0*  --  1.6  --     Liver Function Tests: Recent Labs  Lab 01/13/21 1409  AST 42*  ALT 24  ALKPHOS 74  BILITOT 3.0*  PROT 7.7  ALBUMIN 5.0   No results for input(s): LIPASE, AMYLASE in the last 168 hours. No results for input(s): AMMONIA in the last 168 hours.  ABG    Component Value Date/Time   PHART 7.394 01/13/2021 1539   PCO2ART 41.3 01/13/2021 1539   PO2ART 530 (H) 01/13/2021 1539   HCO3 25.2 01/13/2021 1539   TCO2 26 01/13/2021 1539   O2SAT 100.0 01/13/2021 1539     Coagulation Profile: No results  for input(s): INR, PROTIME in the last 168 hours.  Cardiac Enzymes: No results for input(s): CKTOTAL, CKMB, CKMBINDEX, TROPONINI in the last 168 hours.  HbA1C: Hgb A1c MFr Bld  Date/Time Value Ref Range Status  01/13/2021 05:02 PM 4.5 (L) 4.8 - 5.6 % Final    Comment:    (NOTE) Pre diabetes:          5.7%-6.4%  Diabetes:              >  6.4%  Glycemic control for   <7.0% adults with diabetes     CBG: Recent Labs  Lab 01/13/21 2008 01/14/21 0012 01/14/21 0052 01/14/21 0330 01/14/21 0416  GLUCAP 94 68* 125* 64* 160*    CRITICAL CARE Performed by: Lanier Clam   Total critical care time: 60 minutes  Critical care time was exclusive of separately billable procedures and treating other patients. Critical care was necessary to treat or prevent imminent or life-threatening deterioration.  Critical care was time spent personally by me on the following activities: development of treatment plan with patient and/or surrogate as well as nursing, discussions with consultants, evaluation of patient's response to treatment, examination of patient, obtaining history from patient or surrogate, ordering and performing treatments and interventions, ordering and review of laboratory studies, ordering and review of radiographic studies, pulse oximetry and re-evaluation of patient's condition.   Tessie Fass MSN, AGACNP-BC [facility] [phone] If no answer, [phone] 01/14/2021, 10:56 AM

## 2021-01-14 NOTE — Progress Notes (Signed)
eLink Physician-Brief Progress Note Patient Name: James Li DOB: Jul 27, 1991 MRN: [id number]   Date of Service  01/14/2021  HPI/Events of Note  Patient vomited 400 - 500 mL after getting Colace, Melatonin and Motrin.   eICU Interventions  Plan: 1. Repeat doses of Melatonin, Motrin and Colace.      Intervention Category Major Interventions: Other:  Lenell Antu 01/14/2021, 10:20 PM

## 2021-01-14 NOTE — Progress Notes (Signed)
NUTRITION NOTE RD working remotely.  Patient screened for new vent. Patient was extubated today at ~1010. He does not screen for any other nutrition-related needs/reason. He remains NPO at this time.  Will communicate with RD who typically covers floor in case nutrition-related needs arise.  Please consult RD as needed if nutrition-related needs or concerns arise.       Trenton Gammon, MS, RD, LDN, CNSC Inpatient Clinical Dietitian RD pager # available in [facility]  After hours/weekend pager # available in [facility]

## 2021-01-15 LAB — COMPREHENSIVE METABOLIC PANEL
ALT: 24 U/L (ref 0–44)
AST: 34 U/L (ref 15–41)
Albumin: 3.4 g/dL — ABNORMAL LOW (ref 3.5–5.0)
Alkaline Phosphatase: 50 U/L (ref 38–126)
Anion gap: 8 (ref 5–15)
BUN: 6 mg/dL (ref 6–20)
CO2: 22 mmol/L (ref 22–32)
Calcium: 8.3 mg/dL — ABNORMAL LOW (ref 8.9–10.3)
Chloride: 110 mmol/L (ref 98–111)
Creatinine, Ser: 0.8 mg/dL (ref 0.61–1.24)
GFR, Estimated: >60 mL/min (ref >60)
Glucose, Bld: 91 mg/dL (ref 70–99)
Potassium: 3.5 mmol/L (ref 3.5–5.1)
Sodium: 140 mmol/L (ref 135–145)
Total Bilirubin: 3.4 mg/dL — ABNORMAL HIGH (ref 0.3–1.2)
Total Protein: 5.4 g/dL — ABNORMAL LOW (ref 6.5–8.1)

## 2021-01-15 LAB — CBC
HCT: 36.1 % — ABNORMAL LOW (ref 39.0–52.0)
Hemoglobin: 12.5 g/dL — ABNORMAL LOW (ref 13.0–17.0)
MCH: 29.2 pg (ref 26.0–34.0)
MCHC: 34.6 g/dL (ref 30.0–36.0)
MCV: 84.3 fL (ref 80.0–100.0)
Platelets: 170 10*3/uL (ref 150–400)
RBC: 4.28 MIL/uL (ref 4.22–5.81)
RDW: 13.9 % (ref 11.5–15.5)
WBC: 5.9 10*3/uL (ref 4.0–10.5)
nRBC: 0 % (ref 0.0–0.2)

## 2021-01-15 LAB — GLUCOSE, CAPILLARY
Glucose-Capillary: 105 mg/dL — ABNORMAL HIGH (ref 70–99)
Glucose-Capillary: 112 mg/dL — ABNORMAL HIGH (ref 70–99)
Glucose-Capillary: 75 mg/dL (ref 70–99)
Glucose-Capillary: 77 mg/dL (ref 70–99)
Glucose-Capillary: 80 mg/dL (ref 70–99)
Glucose-Capillary: 87 mg/dL (ref 70–99)
Glucose-Capillary: 93 mg/dL (ref 70–99)

## 2021-01-15 LAB — BRAIN NATRIURETIC PEPTIDE: B Natriuretic Peptide: 105.3 pg/mL — ABNORMAL HIGH (ref 0.0–100.0)

## 2021-01-15 LAB — C-REACTIVE PROTEIN: CRP: 4.7 mg/dL — ABNORMAL HIGH (ref <1.0)

## 2021-01-15 LAB — D-DIMER, QUANTITATIVE: D-Dimer, Quant: 0.58 ug/mL-FEU — ABNORMAL HIGH (ref 0.00–0.50)

## 2021-01-15 LAB — MAGNESIUM: Magnesium: 1.9 mg/dL (ref 1.7–2.4)

## 2021-01-15 LAB — TSH: TSH: 1.391 u[IU]/mL (ref 0.350–4.500)

## 2021-01-15 MED ORDER — SODIUM CHLORIDE 0.9 % IV SOLN: INTRAVENOUS | Status: DC

## 2021-01-15 MED ORDER — POTASSIUM CHLORIDE CRYS ER 20 MEQ PO TBCR
40.0000 meq | EXTENDED_RELEASE_TABLET | Freq: Once | ORAL | Status: AC
  Administered 2021-01-15: 40 meq via ORAL
  Filled 2021-01-15: qty 2

## 2021-01-15 MED ORDER — HALOPERIDOL LACTATE 5 MG/ML IJ SOLN
2.0000 mg | Freq: Four times a day (QID) | INTRAMUSCULAR | Status: DC | PRN
  Administered 2021-01-15 (×2): 2 mg via INTRAVENOUS
  Filled 2021-01-15 (×2): qty 1

## 2021-01-15 MED ORDER — ENOXAPARIN SODIUM 60 MG/0.6ML ~~LOC~~ SOLN
50.0000 mg | SUBCUTANEOUS | Status: DC
  Administered 2021-01-15: 50 mg via SUBCUTANEOUS
  Filled 2021-01-15: qty 0.6

## 2021-01-15 MED ORDER — HALOPERIDOL LACTATE 5 MG/ML IJ SOLN
1.0000 mg | Freq: Once | INTRAMUSCULAR | Status: AC
  Administered 2021-01-15: 1 mg via INTRAVENOUS
  Filled 2021-01-15: qty 1

## 2021-01-15 NOTE — Evaluation (Signed)
Clinical/Bedside Swallow Evaluation Patient Details  Name: James Li MRN: [id number] Date of Birth: Nov 01, 1991  Today's Date: 01/15/2021 Time: SLP Start Time (ACUTE ONLY): 0845 SLP Stop Time (ACUTE ONLY): 0855 SLP Time Calculation (min) (ACUTE ONLY): 10 min  Past Medical History: No past medical history on file. Past Surgical History:  The histories are not reviewed yet. Please review them in the "History" navigator section and refresh this SmartLink. HPI:  30 y.o. man with history of drug use. Reportedly none in the last 6 months but used heroin prior to admission. Was given narcan by his girlfriend and by EMS. Had witnessed GTC seizure and was intubated for airway protection (ETT-2/13-2/14).   Assessment / Plan / Recommendation Clinical Impression  Pts swallow appears within functional limits post short term intubation. Pt currently exhibits some cognitive deficits however per chart review mentation continues to improve. Oral motor exam unremarkable. No overt s/sx of aspiration with any PO. Recommend regular thin liquid diet. No further ST needs identified. SLP Visit Diagnosis: Dysphagia, unspecified (R13.10)    Aspiration Risk  Mild aspiration risk    Diet Recommendation   Regular thin liquids   Medication Administration: Whole meds with liquid    Other  Recommendations Oral Care Recommendations: Oral care BID   Follow up Recommendations None      Frequency and Duration            Prognosis        Swallow Study   General Date of Onset: 01/13/21 HPI: 30 y.o. man with history of drug use. Reportedly none in the last 6 months but used heroin prior to admission. Was given narcan by his girlfriend and by EMS. Had witnessed GTC seizure and was intubated for airway protection (ETT-2/13-2/14). Type of Study: Bedside Swallow Evaluation Previous Swallow Assessment: none on file Diet Prior to this Study: Dysphagia 3 (soft);Thin liquids Temperature Spikes Noted:  No Respiratory Status: Room air History of Recent Intubation: Yes Length of Intubations (days): 1 days Date extubated: 01/14/21 Behavior/Cognition: Alert;Pleasant mood Oral Cavity Assessment: Within Functional Limits Oral Care Completed by SLP: No Oral Cavity - Dentition: Adequate natural dentition Vision: Functional for self-feeding Self-Feeding Abilities: Able to feed self Patient Positioning: Upright in bed Baseline Vocal Quality: Normal Volitional Cough: Strong Volitional Swallow: Able to elicit    Oral/Motor/Sensory Function Overall Oral Motor/Sensory Function: Within functional limits   Ice Chips Ice chips: Within functional limits Presentation: Spoon   Thin Liquid Thin Liquid: Within functional limits Presentation: Cup;Straw    Nectar Thick Nectar Thick Liquid: Not tested   Honey Thick Honey Thick Liquid: Not tested   Puree Puree: Within functional limits Presentation: Self Fed   Solid     Solid: Within functional limits Presentation: Self Fed      Shaivi Rothschild H. MA, CCC-SLP Acute Rehabilitation Services   01/15/2021,9:06 AM

## 2021-01-15 NOTE — TOC Initial Note (Addendum)
Transition of Care [facility]) - Initial/Assessment Note    Patient Details  Name: James Li MRN: [id number] Date of Birth: 02/04/1991  Transition of [facility]) CM/SW Contact:    James Sabal, RN Phone Number: 01/15/2021, 3:34 PM  Clinical Narrative:                James Li w patient over the phone due to COVID precautions. He confirms demographics. He does not have PCP. [facility] added to AVS. We discussed resources for substance abuse and he was agreeable to resources. Resources sent to unit, instructed bedside nurse to provide list to patient. Patient confirms that his James Li will provide transportation. TOC will follow for medication assistance at time of DC.     Expected Discharge Plan: Home/Self Care     Patient Goals and CMS Choice        Expected Discharge Plan and Services Expected Discharge Plan: Home/Self Care In-house Referral: Clinical Social Work Discharge Planning Services: CM Consult                                          Prior Living Arrangements/Services   Lives with:: Other (Comment)                   Activities of Daily Living Home Assistive Devices/Equipment: None ADL Screening (condition at time of admission) Patient's cognitive ability adequate to safely complete daily activities?: No Is the patient deaf or have difficulty hearing?: No Does the patient have difficulty seeing, even when wearing glasses/contacts?: No Does the patient have difficulty concentrating, remembering, or making decisions?: Yes Patient able to express need for assistance with ADLs?: No Does the patient have difficulty dressing or bathing?: No Independently performs ADLs?: Yes (appropriate for developmental age) Does the patient have difficulty walking or climbing stairs?: Yes Weakness of Legs: Both Weakness of Arms/Hands: Both  Permission Sought/Granted                  Emotional Assessment              Admission diagnosis:  Toxic  encephalopathy [G92.9] Acute respiratory failure with hypoxia (HCC) [J96.01] Accidental overdose of heroin, initial encounter (HCC) [T40.1X1A] COVID-19 [U07.1] Patient Active Problem List   Diagnosis Date Noted  . Toxic encephalopathy 01/13/2021   PCP:  Patient, No Pcp Per Pharmacy:   [facility] [phone] - [place], [place] - [phone] AT [facility] [address] [facility] [address] [place] [address] Phone: [phone] Fax: [phone]     Social Determinants of Health (SDOH) Interventions    Readmission Risk Interventions No flowsheet data found.

## 2021-01-15 NOTE — Progress Notes (Signed)
OT Note Pt seen for evaluation. Full note to follow. Pt not oriented to place/time/situation. Reoriented pt multiple times during session and pt unable to recall information. [place]. Pt states he uses Heroin/fentanyl daily, sometimes 2x/day and has used for 10 years, with increased usage since his Mom died in 36. Pt states he has had multiple overdosages. Girlfriend who he stays with also uses Heroin. Pt with near fall at sink, requiring mod A to prevent fall, due to LOB/poor attention to task. Will follow acutely. Recommend drug rehab.  Luisa Dago, OT/L   Acute OT Clinical Specialist Acute Rehabilitation Services Pager [phone] Office [phone]

## 2021-01-15 NOTE — Progress Notes (Signed)
eLink Physician-Brief Progress Note Patient Name: James Li DOB: Jan 13, 1991 MRN: [id number]   Date of Service  01/15/2021  HPI/Events of Note  Agitation - Patient trying to get out of bed. QTc interval = 0.433 seconds. No camera in room. Unable to do video assessment.   eICU Interventions  Plan: 1. Haldol 1 mg IV X 1 now.      Intervention Category Major Interventions: Delirium, psychosis, severe agitation - evaluation and management  Diya Gervasi Eugene 01/15/2021, 12:22 AM

## 2021-01-15 NOTE — Progress Notes (Signed)
eLink Physician-Brief Progress Note Patient Name: Harrison Zetina DOB: [birth year] MRN: [id number]   Date of Service  01/15/2021  HPI/Events of Note  Nursning concerned about HR in 40's. HR now = 50's-70's. BP = 100/44 with MAP = 59.   eICU Interventions  Continue to trend HR.      Intervention Category Major Interventions: Arrhythmia - evaluation and management  Lynel Forester Eugene 01/15/2021, 4:21 AM

## 2021-01-15 NOTE — Progress Notes (Addendum)
PROGRESS NOTE                                                                                                                                                                                                             Patient Demographics:    James Li, is a 30 y.o. male, DOB - 1991-09-07, WGN:[id number]  Outpatient Primary MD for the patient is Patient, No Pcp Per    LOS - 2  Admit date - 01/13/2021    Chief Complaint  Patient presents with  . Drug Overdose       Brief Narrative (HPI from H&P)   30 year old male with history of polysubstance abuse in the past who recently did large doses of IV heroin and became unresponsive, was brought to the ER with severe toxic encephalopathy with inability to protect airway, he was seen by ICU team and intubated for airway protection.  He was provided supportive care extubated and transferred to hospitalist service on 01/15/2021.  He was found to have incidental COVID infection.   Subjective:    James Li today has, No headache, No chest pain, No abdominal pain - No Nausea, No new weakness tingling or numbness, no Cough - SOB.     Assessment  & Plan :     1. Incidental COVID-19 infection in a patient who is unvaccinated. - He is fortunately asymptomatic and appears fine, will monitor closely, if remains symptom-free and no other issues likely discharge early tomorrow.  Encouraged the patient to sit up in chair in the daytime use I-S and flutter valve for pulmonary toiletry and then prone in bed when at night.  Will advance activity and titrate down oxygen as possible.   Recent Labs  Lab 01/13/21 1409 01/13/21 1410 01/13/21 1411 01/13/21 1435 01/13/21 1534 01/13/21 1539 01/13/21 1702 01/14/21 0521 01/15/21 0856  WBC  --   --   --   --  11.4*  --   --  7.0 5.9  HGB  --   --  17.0  --  13.5 13.3  --  12.3* 12.5*  HCT  --   --  50.0  --  41.7 39.0  --  35.6*  36.1*  PLT  --   --   --   --  231  --   --  181 170  BNP  --   --   --   --   --   --   --   --  105.3*  DDIMER  --   --   --   --   --   --   --   --  0.58*  PROCALCITON  --   --   --   --   --   --  <0.10  --   --   AST 42*  --   --   --   --   --   --  32 34  ALT 24  --   --   --   --   --   --  18 24  ALKPHOS 74  --   --   --   --   --   --  46 50  BILITOT 3.0*  --   --   --   --   --   --  3.2* 3.4*  ALBUMIN 5.0  --   --   --   --   --   --  3.3* 3.4*  LATICACIDVEN  --  >11.0*  --   --   --   --  1.6  --   --   SARSCOV2NAA  --   --   --  POSITIVE*  --   --   --   --   --     2.  Accidental IV fentanyl drug overdose causing toxic encephalopathy requiring intubation for a day for airway protection.  This problem has resolved, he has been extensively counseled to refrain from recreational drug abuse, case management and social worker trying to provide him outpatient assistance.  Monitor closely.  He is not suicidal or homicidal, his encephalopathy has improved and he is close to his baseline now.  Note in the afternoon slightly more encephalopathic, no focal deficits or headache, as needed Haldol, fall precautions and monitor.      Condition - Extremely Guarded  Family Communication  : She does not want family or friends to be called.  Code Status : Full  Consults  : PCCM  PUD Prophylaxis : pepcid   Procedures  :     CT scan brain and chest x-ray.  Nonacute.      Disposition Plan  :    Status is: Inpatient  Remains inpatient appropriate because:IV treatments appropriate due to intensity of illness or inability to take PO   Dispo: The patient is from: Home              Anticipated d/c is to: Home              Anticipated d/c date is: 3 days              Patient currently is not medically stable to d/c.   Difficult to place patient No  DVT Prophylaxis  :  Lovenox    Lab Results  Component Value Date   PLT 170 01/15/2021    Diet :  Diet Order            Diet  regular Room service appropriate? Yes; Fluid consistency: Thin  Diet effective now                  Inpatient Medications  Scheduled Meds: . Chlorhexidine Gluconate Cloth  6 each Topical Daily  . docusate  100 mg Per Tube BID  . enoxaparin (LOVENOX) injection  50 mg Subcutaneous Q24H  . folic acid  1 mg Intravenous Daily  . influenza vac split quadrivalent PF  0.5 mL Intramuscular Tomorrow-1000  .  multivitamin with minerals  1 tablet Per Tube Daily  . polyethylene glycol  17 g Per Tube Daily  . thiamine  100 mg Intravenous Daily   Continuous Infusions: . sodium chloride 50 mL/hr at 01/15/21 1100  . famotidine (PEPCID) IV Stopped (01/15/21 1018)   PRN Meds:.acetaminophen, albuterol, docusate, melatonin, naLOXone (NARCAN)  injection, ondansetron (ZOFRAN) IV, polyethylene glycol  Antibiotics  :    Anti-infectives (From admission, onward)   None       Time Spent in minutes  30   Susa RaringPrashant Wilhelmenia Addis M.D on 01/15/2021 at 11:56 AM  To page go to [email or web address]   [facility] -  Office  [phone]    See all Orders from today for further details    Objective:   Vitals:   01/15/21 0356 01/15/21 0421 01/15/21 0600 01/15/21 0734  BP: (!) 97/49 (!) 108/48  116/61  Pulse: (!) 44 (!) 57  78  Resp: 16 17  13   Temp: 97.7 F (36.5 C) 97.7 F (36.5 C)  98.5 F (36.9 C)  TempSrc: Oral Oral  Oral  SpO2: 97% 96%  99%  Weight:   98.2 kg   Height:        Wt Readings from Last 3 Encounters:  01/15/21 98.2 kg     Intake/Output Summary (Last 24 hours) at 01/15/2021 1156 Last data filed at 01/15/2021 1100 Gross per 24 hour  Intake 2410.3 ml  Output 2175 ml  Net 235.3 ml     Physical Exam  Awake Alert, No new F.N deficits, Normal affect [place].AT,PERRAL Supple Neck,No JVD, No cervical lymphadenopathy appriciated.  Symmetrical Chest wall movement, Good air movement bilaterally, CTAB RRR,No Gallops,Rubs or new Murmurs, No Parasternal Heave +ve B.Sounds, Abd Soft, No  tenderness, No organomegaly appriciated, No rebound - guarding or rigidity. No Cyanosis, Clubbing or edema, No new Rash or bruise      Data Review:    CBC Recent Labs  Lab 01/13/21 1411 01/13/21 1534 01/13/21 1539 01/14/21 0521 01/15/21 0856  WBC  --  11.4*  --  7.0 5.9  HGB 17.0 13.5 13.3 12.3* 12.5*  HCT 50.0 41.7 39.0 35.6* 36.1*  PLT  --  231  --  181 170  MCV  --  84.6  --  83.2 84.3  MCH  --  27.4  --  28.7 29.2  MCHC  --  32.4  --  34.6 34.6  RDW  --  13.6  --  14.0 13.9  LYMPHSABS  --  0.7  --   --   --   MONOABS  --  0.7  --   --   --   EOSABS  --  0.0  --   --   --   BASOSABS  --  0.0  --   --   --     Recent Labs  Lab 01/13/21 1409 01/13/21 1410 01/13/21 1411 01/13/21 1539 01/13/21 1702 01/14/21 0521 01/15/21 0856  NA 149*  --  147* 144  --  142 140  K 5.1  --  5.1 4.2  --  3.3* 3.5  CL 106  --  109  --   --  113* 110  CO2 19*  --   --   --   --  21* 22  GLUCOSE 240*  --  224*  --   --  101* 91  BUN 16  --  19  --   --  14 6  CREATININE 1.27*  --  1.00  --   --  0.96 0.80  CALCIUM 10.2  --   --   --   --  8.0* 8.3*  AST 42*  --   --   --   --  32 34  ALT 24  --   --   --   --  18 24  ALKPHOS 74  --   --   --   --  46 50  BILITOT 3.0*  --   --   --   --  3.2* 3.4*  ALBUMIN 5.0  --   --   --   --  3.3* 3.4*  MG  --   --   --   --  2.3 1.9 1.9  DDIMER  --   --   --   --   --   --  0.58*  PROCALCITON  --   --   --   --  <0.10  --   --   LATICACIDVEN  --  >11.0*  --   --  1.6  --   --   HGBA1C  --   --   --   --  4.5*  --   --   BNP  --   --   --   --   --   --  105.3*    ------------------------------------------------------------------------------------------------------------------ Recent Labs    01/14/21 0521  TRIG 92    Lab Results  Component Value Date   HGBA1C 4.5 (L) 01/13/2021   ------------------------------------------------------------------------------------------------------------------ No results for input(s): TSH, T4TOTAL,  T3FREE, THYROIDAB in the last 72 hours.  Invalid input(s): FREET3  Cardiac Enzymes No results for input(s): CKMB, TROPONINI, MYOGLOBIN in the last 168 hours.  Invalid input(s): CK ------------------------------------------------------------------------------------------------------------------    Component Value Date/Time   BNP 105.3 (H) 01/15/2021 5400    Micro Results Recent Results (from the past 240 hour(s))  Resp Panel by RT-PCR (Flu A&B, Covid) Nasopharyngeal Swab     Status: Abnormal   Collection Time: 01/13/21  2:35 PM   Specimen: Nasopharyngeal Swab; Nasopharyngeal(NP) swabs in vial transport medium  Result Value Ref Range Status   SARS Coronavirus 2 by RT PCR POSITIVE (A) NEGATIVE Final    Comment: RESULT CALLED TO, READ BACK BY AND VERIFIED WITH: RSusette Racer RN [phone] 01/13/21 D VANHOOK (NOTE) SARS-CoV-2 target nucleic acids are DETECTED.  The SARS-CoV-2 RNA is generally detectable in upper respiratory specimens during the acute phase of infection. Positive results are indicative of the presence of the identified virus, but do not rule out bacterial infection or co-infection with other pathogens not detected by the test. Clinical correlation with patient history and other diagnostic information is necessary to determine patient infection status. The expected result is Negative.  Fact Sheet for Patients: BloggerCourse.com  Fact Sheet for Healthcare Providers: SeriousBroker.it  This test is not yet approved or cleared by the [place] FDA and  has been authorized for detection and/or diagnosis of SARS-CoV-2 by FDA under an Emergency Use Authorization (EUA).  This EUA will remain in effect (meaning this test can b e used) for the duration of  the COVID-19 declaration under Section 564(b)(1) of the Act, 21 U.S.C. section 360bbb-3(b)(1), unless the authorization is terminated or revoked sooner.     Influenza A by  PCR NEGATIVE NEGATIVE Final   Influenza B by PCR NEGATIVE NEGATIVE Final    Comment: (NOTE) The Xpert Xpress SARS-CoV-2/FLU/RSV plus assay is intended as an aid in the diagnosis of influenza from Nasopharyngeal swab specimens and should not  be used as a sole basis for treatment. Nasal washings and aspirates are unacceptable for Xpert Xpress SARS-CoV-2/FLU/RSV testing.  Fact Sheet for Patients: BloggerCourse.com  Fact Sheet for Healthcare Providers: SeriousBroker.it  This test is not yet approved or cleared by the [place] FDA and has been authorized for detection and/or diagnosis of SARS-CoV-2 by FDA under an Emergency Use Authorization (EUA). This EUA will remain in effect (meaning this test can be used) for the duration of the COVID-19 declaration under Section 564(b)(1) of the Act, 21 U.S.C. section 360bbb-3(b)(1), unless the authorization is terminated or revoked.  Performed at [facility], [address], [place], [place] [address]   Culture, blood (routine x 2)     Status: None (Preliminary result)   Collection Time: 01/13/21  5:02 PM   Specimen: BLOOD RIGHT HAND  Result Value Ref Range Status   Specimen Description BLOOD RIGHT HAND  Final   Special Requests   Final    BOTTLES DRAWN AEROBIC AND ANAEROBIC Blood Culture adequate volume   Culture   Final    NO GROWTH < 24 HOURS Performed at [facility], [address] [place], [place] [address]    Report Status PENDING  Incomplete  MRSA PCR Screening     Status: None   Collection Time: 01/13/21  6:24 PM   Specimen: Nasopharyngeal  Result Value Ref Range Status   MRSA by PCR NEGATIVE NEGATIVE Final    Comment:        The GeneXpert MRSA Assay (FDA approved for NASAL specimens only), is one component of a comprehensive MRSA colonization surveillance program. It is not intended to diagnose MRSA infection nor to guide or monitor treatment for MRSA  infections. Performed at [facility], [address], [place], [place] [address]   Culture, blood (routine x 2)     Status: None (Preliminary result)   Collection Time: 01/13/21  8:00 PM   Specimen: BLOOD  Result Value Ref Range Status   Specimen Description BLOOD SITE NOT SPECIFIED  Final   Special Requests   Final    BOTTLES DRAWN AEROBIC ONLY Blood Culture results may not be optimal due to an inadequate volume of blood received in culture bottles   Culture   Final    NO GROWTH < 24 HOURS Performed at [facility], [address] [place], [place] [address]    Report Status PENDING  Incomplete    Radiology Reports CT Head Wo Contrast  Result Date: 01/13/2021 CLINICAL DATA:  Seizure EXAM: CT HEAD WITHOUT CONTRAST TECHNIQUE: Contiguous axial images were obtained from the base of the skull through the vertex without intravenous contrast. COMPARISON:  None. FINDINGS: Brain: Ventricles and sulci are normal in size and configuration. There is no intracranial mass, hemorrhage, extra-axial fluid collection, or midline shift. The brain parenchyma appears unremarkable. There is no evident acute infarct. Vascular: There is no hyperdense vessel. No evident vascular calcification. Skull: Bony calvarium appears intact. Sinuses/Orbits: Mucosal thickening noted in several ethmoid air cells. Orbits appear symmetric bilaterally. Other: Mastoid air cells are clear. IMPRESSION: Brain parenchyma appears unremarkable. No mass or hemorrhage. There is mucosal thickening in several ethmoid air cells. Electronically Signed   By: Bretta Bang III M.D.   On: 01/13/2021 15:37   DG Chest Portable 1 View  Result Date: 01/13/2021 CLINICAL DATA:  Status post intubation. EXAM: PORTABLE CHEST 1 VIEW COMPARISON:  Chest x-ray dated January 04, 2017. FINDINGS: Endotracheal tube tip 4.7 cm  above the carina. Enteric tube in the stomach with the tip in the gastric fundus. The heart size and mediastinal contours  are within normal limits. Normal pulmonary vascularity. No focal consolidation, pleural effusion, or pneumothorax. No acute osseous abnormality. IMPRESSION: 1. Appropriately positioned endotracheal and enteric tubes. 2. No active disease. Electronically Signed   By: Obie Dredge M.D.   On: 01/13/2021 14:36

## 2021-01-15 NOTE — Progress Notes (Signed)
Ok to change SQ heparin to Lovenox 50mg  SQ qday per Dr. .  Thedore Mins, PharmD, BCIDP, AAHIVP, CPP Infectious Disease Pharmacist 01/15/2021 11:49 AM

## 2021-01-15 NOTE — Progress Notes (Signed)
Physical Therapy Treatment Patient Details Name: James Li MRN: [id number] DOB: October 03, 1991 Today's Date: 01/15/2021    History of Present Illness Pt is a 30 y.o. male admitted 01/13/21 with reportedly accidental heroin overdose, given narcan by girlfriend and EMS. Had witnessed GTC seizure, ETT 2/13-2/14. Incidental (+) COVID-19. Course complicated by AMS, agitation. PMH includes IVDA (pt reports no use in past 6 months until day of admission).   PT Comments    Pt progressing with mobility, remains limited by cognitive impairment, including disorientation, decreased attention, poor memory, difficulty problem solving and poor awareness. Pt reoriented to current location at least 5x throughout session, still unable to answer correctly each time. Pt's stability improving with hallway ambulation, still remains at risk for falls due to impaired balance strategies/postural reactions. Do not expect pt to require follow-up PT services, but will need supervision for safety upon return home. Will continue to follow acutely to address established goals.    Follow Up Recommendations  No PT follow up;Supervision for mobility/OOB     Equipment Recommendations  None recommended by PT    Recommendations for Other Services       Precautions / Restrictions Precautions Precautions: Fall Restrictions Weight Bearing Restrictions: No    Mobility  Bed Mobility Overal bed mobility: Modified Independent                  Transfers Overall transfer level: Needs assistance Equipment used: None Transfers: Sit to/from Stand Sit to Stand: Min guard         General transfer comment: Pt asked to stay seated to allow untangling of lines, pt standing anways, initial LOB with fall back onto bed; min guard for balance  Ambulation/Gait Ambulation/Gait assistance: Min guard;Min assist Gait Distance (Feet): 600 Feet Assistive device: None;IV Pole Gait Pattern/deviations: Step-through  pattern;Decreased stride length Gait velocity: Decreased   General Gait Details: Hallway ambulation with and without pushing IV pole, min guard for balance due to frequent self-corrected instability, 2x LOB requiring minA to correct; stability improving with gait distance. Pt unable to get back to his room despite cues for room # and direction   Stairs             Wheelchair Mobility    Modified Rankin (Stroke Patients Only)       Balance Overall balance assessment: Needs assistance   Sitting balance-Leahy Scale: Good       Standing balance-Leahy Scale: Fair                              Cognition Arousal/Alertness: Awake/alert Behavior During Therapy: Flat affect Overall Cognitive Status: Impaired/Different from baseline Area of Impairment: Orientation;Attention;Memory;Following commands;Safety/judgement;Awareness;Problem solving                 Orientation Level: Disoriented to;Place;Time;Situation Current Attention Level: Sustained Memory: Decreased short-term memory Following Commands: Follows one step commands inconsistently Safety/Judgement: Decreased awareness of safety;Decreased awareness of deficits Awareness: Intellectual Problem Solving: Slow processing;Decreased initiation;Difficulty sequencing;Requires verbal cues General Comments: States it's March 2022; unable to get correct month despite cues, unable to state what day Valentine's Day is when attempting to cue for today's date; asking if he's in [facility]. Reoriented to current location at least 5x throughout session and pt always replying, "I don't know" when asked where he is again. When reoriented to situation, pt states, "I didn't overdose." Initially distracted by wanting to find a phone charging      Exercises  General Comments        Pertinent Vitals/Pain Pain Assessment: Faces Faces Pain Scale: Hurts a little bit Pain Location: Lower back Pain Descriptors /  Indicators: Discomfort Pain Intervention(s): Monitored during session    Home Living                      Prior Function            PT Goals (current goals can now be found in the care plan section) Acute Rehab PT Goals Patient Stated Goal: "So when am I getting out of here? PT Goal Formulation: With patient Time For Goal Achievement: 01/28/21 Potential to Achieve Goals: Good Progress towards PT goals: Progressing toward goals    Frequency    Min 4X/week      PT Plan Current plan remains appropriate    Co-evaluation              AM-PAC PT "6 Clicks" Mobility   Outcome Measure  Help needed turning from your back to your side while in a flat bed without using bedrails?: None Help needed moving from lying on your back to sitting on the side of a flat bed without using bedrails?: None Help needed moving to and from a bed to a chair (including a wheelchair)?: A Little Help needed standing up from a chair using your arms (e.g., wheelchair or bedside chair)?: A Little Help needed to walk in hospital room?: A Little Help needed climbing 3-5 steps with a railing? : A Little 6 Click Score: 20    End of Session Equipment Utilized During Treatment: Gait belt Activity Tolerance: Patient tolerated treatment well Patient left: in bed;with call bell/phone within reach;with bed alarm set Nurse Communication: Mobility status;Other (comment) (disorientation) PT Visit Diagnosis: Other symptoms and signs involving the nervous system (I34.742)     Time: 5956-3875 PT Time Calculation (min) (ACUTE ONLY): 20 min  Charges:  $Gait Training: 8-22 mins                     Ina Homes, PT, DPT Acute Rehabilitation Services  Pager [phone] Office [phone]  Malachy Chamber 01/15/2021, 10:55 AM

## 2021-01-16 ENCOUNTER — Inpatient Hospital Stay (HOSPITAL_COMMUNITY): Payer: Self-pay

## 2021-01-16 LAB — TSH: TSH: 2.994 u[IU]/mL (ref 0.350–4.500)

## 2021-01-16 LAB — CBC WITH DIFFERENTIAL/PLATELET
Abs Immature Granulocytes: 0.01 10*3/uL (ref 0.00–0.07)
Basophils Absolute: 0 10*3/uL (ref 0.0–0.1)
Basophils Relative: 0 %
Eosinophils Absolute: 0.1 10*3/uL (ref 0.0–0.5)
Eosinophils Relative: 1 %
HCT: 37.2 % — ABNORMAL LOW (ref 39.0–52.0)
Hemoglobin: 12.1 g/dL — ABNORMAL LOW (ref 13.0–17.0)
Immature Granulocytes: 0 %
Lymphocytes Relative: 30 %
Lymphs Abs: 1.4 10*3/uL (ref 0.7–4.0)
MCH: 27.9 pg (ref 26.0–34.0)
MCHC: 32.5 g/dL (ref 30.0–36.0)
MCV: 85.9 fL (ref 80.0–100.0)
Monocytes Absolute: 0.4 10*3/uL (ref 0.1–1.0)
Monocytes Relative: 9 %
Neutro Abs: 2.8 10*3/uL (ref 1.7–7.7)
Neutrophils Relative %: 60 %
Platelets: 136 10*3/uL — ABNORMAL LOW (ref 150–400)
RBC: 4.33 MIL/uL (ref 4.22–5.81)
RDW: 13.5 % (ref 11.5–15.5)
WBC: 4.7 10*3/uL (ref 4.0–10.5)
nRBC: 0 % (ref 0.0–0.2)

## 2021-01-16 LAB — GLUCOSE, CAPILLARY
Glucose-Capillary: 81 mg/dL (ref 70–99)
Glucose-Capillary: 77 mg/dL (ref 70–99)

## 2021-01-16 LAB — COMPREHENSIVE METABOLIC PANEL
ALT: 47 U/L — ABNORMAL HIGH (ref 0–44)
AST: 57 U/L — ABNORMAL HIGH (ref 15–41)
Albumin: 3.4 g/dL — ABNORMAL LOW (ref 3.5–5.0)
Alkaline Phosphatase: 48 U/L (ref 38–126)
Anion gap: 8 (ref 5–15)
BUN: <5 mg/dL — ABNORMAL LOW (ref 6–20)
CO2: 24 mmol/L (ref 22–32)
Calcium: 8.4 mg/dL — ABNORMAL LOW (ref 8.9–10.3)
Chloride: 111 mmol/L (ref 98–111)
Creatinine, Ser: 0.78 mg/dL (ref 0.61–1.24)
GFR, Estimated: >60 mL/min (ref >60)
Glucose, Bld: 83 mg/dL (ref 70–99)
Potassium: 3.7 mmol/L (ref 3.5–5.1)
Sodium: 143 mmol/L (ref 135–145)
Total Bilirubin: 2.8 mg/dL — ABNORMAL HIGH (ref 0.3–1.2)
Total Protein: 5.6 g/dL — ABNORMAL LOW (ref 6.5–8.1)

## 2021-01-16 LAB — C-REACTIVE PROTEIN: CRP: 2.4 mg/dL — ABNORMAL HIGH (ref <1.0)

## 2021-01-16 LAB — BRAIN NATRIURETIC PEPTIDE: B Natriuretic Peptide: 100.7 pg/mL — ABNORMAL HIGH (ref 0.0–100.0)

## 2021-01-16 LAB — MAGNESIUM: Magnesium: 1.7 mg/dL (ref 1.7–2.4)

## 2021-01-16 LAB — D-DIMER, QUANTITATIVE: D-Dimer, Quant: 0.62 ug/mL-FEU — ABNORMAL HIGH (ref 0.00–0.50)

## 2021-01-16 NOTE — Progress Notes (Signed)
Occupational Therapy Evaluation - late entry  Pt is homeless and presents with significant cognitive deficits as noted below. Currently requires assistance with ADL and mobility. Recommend drug rehab. Will follow acutely.    01/15/21 1500  OT Visit Information  Last OT Received On 01/15/21  Assistance Needed +1  History of Present Illness Pt is a 30 y.o. male admitted 01/13/21 with reportedly accidental heroin overdose, given narcan by girlfriend and EMS. Had witnessed GTC seizure, ETT 2/13-2/14. Incidental (+) COVID-19. Course complicated by AMS, agitation. PMH includes IVDA (pt reports no use in past 6 months until day of admission).  Precautions  Precautions Fall  Home Living  Family/patient expects to be discharged to: Unsure (From [facility])  Prior Function  Level of Independence Independent  Comments Lives "on the streets". found in hotel [place] with girlfriend  Communication  Communication No difficulties  Pain Assessment  Pain Assessment No/denies pain  Cognition  Arousal/Alertness Awake/alert  Behavior During Therapy Flat affect;Impulsive  Overall Cognitive Status Impaired/Different from baseline  Area of Impairment Orientation;Attention;Memory;Safety/judgement;Awareness;Problem solving  Orientation Level Disoriented to;Place;Time;Situation  Current Attention Level Sustained  Memory Decreased recall of precautions;Decreased short-term memory  Following Commands Follows one step commands with increased time  Safety/Judgement Decreased awareness of safety;Decreased awareness of deficits  Awareness Intellectual  Problem Solving Slow processing;Decreased initiation;Difficulty sequencing;Requires verbal cues  General Comments REoriented multiple times and pt unable to recall information. Pt however at end of session staes he was in the hospital because of a drug overdose. Pt's Mom appaerntly died in 70 and his [place] to [place]? Does not have a relationship with his  father per pt. States it is nice to have a bed to sleep in then states he is ready to leave and doesn't know why he is still in the hospital. Does not think he has Covid  Upper Extremity Assessment  Upper Extremity Assessment Overall WFL for tasks assessed  Lower Extremity Assessment  Lower Extremity Assessment Defer to PT evaluation  Cervical / Trunk Assessment  Cervical / Trunk Assessment Normal  ADL  Overall ADL's  Needs assistance/impaired  Grooming Standing;Min guard  Upper Body Bathing Supervision/ safety;Set up;Sitting  Lower Body Bathing Minimal assistance;Sit to/from stand  Upper Body Dressing  Set up;Supervision/safety;Sitting  Lower Body Dressing Minimal assistance;Sit to/from Paramedic Details (indicate cue type and reason) unsteady giat  Functional mobility during ADLs Min guard;Moderate assistance  General ADL Comments LOB while standing at sink requiring physical assistance to prevent fall L  Vision- Assessment  Additional Comments reports no deficits; will further assess  Bed Mobility  Overal bed mobility Modified Independent  Transfers  Overall transfer level Needs assistance  Transfers Sit to/from Stand  Sit to Stand Min guard  Balance  Overall balance assessment Needs assistance  Sitting balance-Leahy Scale Good  Standing balance-Leahy Scale Fair  General Comments  General comments (skin integrity, edema, etc.) VSS on RA  OT - End of Session  Activity Tolerance Patient tolerated treatment well  Patient left in bed;with call bell/phone within reach;with bed alarm set  Nurse Communication Mobility status;Other (comment) (DC needs)  OT Assessment  OT Recommendation/Assessment Patient needs continued OT Services  OT Visit Diagnosis Unsteadiness on feet (R26.81);Other abnormalities of gait and mobility (R26.89);Other symptoms and signs involving cognitive function  OT Problem List Decreased activity  tolerance;Impaired balance (sitting and/or standing);Decreased cognition;Decreased safety awareness;Decreased knowledge of use of DME or AE  Barriers to Discharge Other (comment) (homeless)  OT Plan  OT Frequency (ACUTE ONLY) Min 2X/week  OT Treatment/Interventions (ACUTE ONLY) Self-care/ADL training;Therapeutic exercise;DME and/or AE instruction;Therapeutic activities;Cognitive remediation/compensation;Patient/family education;Balance training  AM-PAC OT "6 Clicks" Daily Activity Outcome Measure (Version 2)  Help from another person eating meals? 4  Help from another person taking care of personal grooming? 3  Help from another person toileting, which includes using toliet, bedpan, or urinal? 3  Help from another person bathing (including washing, rinsing, drying)? 3  Help from another person to put on and taking off regular upper body clothing? 3  Help from another person to put on and taking off regular lower body clothing? 3  6 Click Score 19  OT Recommendation  Recommendations for Other Services Other (comment) (Drug Counseling/SW)  Follow Up Recommendations Other (comment) (Drug Rehab)  OT Equipment None recommended by OT  Individuals Consulted  Consulted and Agree with Results and Recommendations Patient unable/family or caregiver not available  Acute Rehab OT Goals  Patient Stated Goal "to stay in bed...to get out of here"  OT Goal Formulation Patient unable to participate in goal setting  Time For Goal Achievement 01/29/21  Potential to Achieve Goals Good  OT Time Calculation  OT Start Time (ACUTE ONLY) 1548  OT Stop Time (ACUTE ONLY) 1615  OT Time Calculation (min) 27 min  OT General Charges  $OT Visit 1 Visit  Written Expression  Dominant Hand Right  Luisa Dago, OT/L   Acute OT Clinical Specialist Acute Rehabilitation Services Pager [phone] Office [phone]

## 2021-01-16 NOTE — Discharge Instructions (Signed)
Follow with Primary MD in 7 days   Get CBC, CMP, 2 view Chest X ray -  checked next visit within 1 week by Primary MD   Activity: As tolerated with Full fall precautions use walker/cane & assistance as needed  Disposition Home    Diet: Heart Healthy   Special Instructions: If you have smoked or chewed Tobacco  in the last 2 yrs please stop smoking, stop any regular Alcohol  and or any Recreational drug use.  On your next visit with your primary care physician please Get Medicines reviewed and adjusted.  Please request your Prim.MD to go over all Hospital Tests and Procedure/Radiological results at the follow up, please get all Hospital records sent to your Prim MD by signing hospital release before you go home.  If you experience worsening of your admission symptoms, develop shortness of breath, life threatening emergency, suicidal or homicidal thoughts you must seek medical attention immediately by calling 911 or calling your MD immediately  if symptoms less severe.  You Must read complete instructions/literature along with all the possible adverse reactions/side effects for all the Medicines you take and that have been prescribed to you. Take any new Medicines after you have completely understood and accpet all the possible adverse reactions/side effects.    Do not drive when taking Pain medications or if under the effect of any drugs.  Do not take more than prescribed Pain, Sleep and Anxiety Medications  Wear Seat belts while driving.      Person Under Monitoring Name: James Li  Location: [address] [place] [facility] [address]   Infection Prevention Recommendations for Individuals Confirmed to have, or Being Evaluated for, 2019 Novel Coronavirus (COVID-19) Infection Who Receive Care at Home  Individuals who are confirmed to have, or are being evaluated for, COVID-19 should follow the prevention steps below until a healthcare provider or local or state  health department says they can return to normal activities.  Stay home except to get medical care You should restrict activities outside your home, except for getting medical care. Do not go to work, school, or public areas, and do not use public transportation or taxis.  Call ahead before visiting your doctor Before your medical appointment, call the healthcare provider and tell them that you have, or are being evaluated for, COVID-19 infection. This will help the healthcare provider's office take steps to keep other people from getting infected. Ask your healthcare provider to call the local or state health department.  Monitor your symptoms Seek prompt medical attention if your illness is worsening (e.g., difficulty breathing). Before going to your medical appointment, call the healthcare provider and tell them that you have, or are being evaluated for, COVID-19 infection. Ask your healthcare provider to call the local or state health department.  Wear a facemask You should wear a facemask that covers your nose and mouth when you are in the same room with other people and when you visit a healthcare provider. People who live with or visit you should also wear a facemask while they are in the same room with you.  Separate yourself from other people in your home As much as possible, you should stay in a different room from other people in your home. Also, you should use a separate bathroom, if available.  Avoid sharing household items You should not share dishes, drinking glasses, cups, eating utensils, towels, bedding, or other items with other people in your home. After using these items, you should  wash them thoroughly with soap and water.  Cover your coughs and sneezes Cover your mouth and nose with a tissue when you cough or sneeze, or you can cough or sneeze into your sleeve. Throw used tissues in a lined trash can, and immediately wash your hands with soap and water for at  least 20 seconds or use an alcohol-based hand rub.  Wash your [employer] your hands often and thoroughly with soap and water for at least 20 seconds. You can use an alcohol-based hand sanitizer if soap and water are not available and if your hands are not visibly dirty. Avoid touching your eyes, nose, and mouth with unwashed hands.   Prevention Steps for Caregivers and Household Members of Individuals Confirmed to have, or Being Evaluated for, COVID-19 Infection Being Cared for in the Home  If you live with, or provide care at home for, a person confirmed to have, or being evaluated for, COVID-19 infection please follow these guidelines to prevent infection:  Follow healthcare provider's instructions Make sure that you understand and can help the patient follow any healthcare provider instructions for all care.  Provide for the patient's basic needs You should help the patient with basic needs in the home and provide support for getting groceries, prescriptions, and other personal needs.  Monitor the patient's symptoms If they are getting sicker, call his or her medical provider and tell them that the patient has, or is being evaluated for, COVID-19 infection. This will help the healthcare provider's office take steps to keep other people from getting infected. Ask the healthcare provider to call the local or state health department.  Limit the number of people who have contact with the patient  If possible, have only one caregiver for the patient.  Other household members should stay in another home or place of residence. If this is not possible, they should stay  in another room, or be separated from the patient as much as possible. Use a separate bathroom, if available.  Restrict visitors who do not have an essential need to be in the home.  Keep older adults, very young children, and other sick people away from the patient Keep older adults, very young children, and those  who have compromised immune systems or chronic health conditions away from the patient. This includes people with chronic heart, lung, or kidney conditions, diabetes, and cancer.  Ensure good ventilation Make sure that shared spaces in the home have good air flow, such as from an air conditioner or an opened window, weather permitting.  Wash your hands often  Wash your hands often and thoroughly with soap and water for at least 20 seconds. You can use an alcohol based hand sanitizer if soap and water are not available and if your hands are not visibly dirty.  Avoid touching your eyes, nose, and mouth with unwashed hands.  Use disposable paper towels to dry your hands. If not available, use dedicated cloth towels and replace them when they become wet.  Wear a facemask and gloves  Wear a disposable facemask at all times in the room and gloves when you touch or have contact with the patient's blood, body fluids, and/or secretions or excretions, such as sweat, saliva, sputum, nasal mucus, vomit, urine, or feces.  Ensure the mask fits over your nose and mouth tightly, and do not touch it during use.  Throw out disposable facemasks and gloves after using them. Do not reuse.  Wash your hands immediately after removing your facemask  and gloves.  If your personal clothing becomes contaminated, carefully remove clothing and launder. Wash your hands after handling contaminated clothing.  Place all used disposable facemasks, gloves, and other waste in a lined container before disposing them with other household waste.  Remove gloves and wash your hands immediately after handling these items.  Do not share dishes, glasses, or other household items with the patient  Avoid sharing household items. You should not share dishes, drinking glasses, cups, eating utensils, towels, bedding, or other items with a patient who is confirmed to have, or being evaluated for, COVID-19 infection.  After the person  uses these items, you should wash them thoroughly with soap and water.  Wash laundry thoroughly  Immediately remove and wash clothes or bedding that have blood, body fluids, and/or secretions or excretions, such as sweat, saliva, sputum, nasal mucus, vomit, urine, or feces, on them.  Wear gloves when handling laundry from the patient.  Read and follow directions on labels of laundry or clothing items and detergent. In general, wash and dry with the warmest temperatures recommended on the label.  Clean all areas the individual has used often  Clean all touchable surfaces, such as counters, tabletops, doorknobs, bathroom fixtures, toilets, phones, keyboards, tablets, and bedside tables, every day. Also, clean any surfaces that may have blood, body fluids, and/or secretions or excretions on them.  Wear gloves when cleaning surfaces the patient has come in contact with.  Use a diluted bleach solution (e.g., dilute bleach with 1 part bleach and 10 parts water) or a household disinfectant with a label that says EPA-registered for coronaviruses. To make a bleach solution at home, add 1 tablespoon of bleach to 1 quart (4 cups) of water. For a larger supply, add  cup of bleach to 1 gallon (16 cups) of water.  Read labels of cleaning products and follow recommendations provided on product labels. Labels contain instructions for safe and effective use of the cleaning product including precautions you should take when applying the product, such as wearing gloves or eye protection and making sure you have good ventilation during use of the product.  Remove gloves and wash hands immediately after cleaning.  Monitor yourself for signs and symptoms of illness Caregivers and household members are considered close contacts, should monitor their health, and will be asked to limit movement outside of the home to the extent possible. Follow the monitoring steps for close contacts listed on the symptom  monitoring form.   ? If you have additional questions, contact your local health department or call the epidemiologist on call at [phone] (available 24/7). ? This guidance is subject to change. For the most up-to-date guidance from [facility], please refer to their website: [email or web address]

## 2021-01-16 NOTE — Progress Notes (Signed)
Pt ambulated around room without assistance. Tolerated well. Pt with no complaints. Returned to bed.

## 2021-01-16 NOTE — Progress Notes (Signed)
Occupational Therapy Treatment Patient Details Name: James Li MRN: [id number] DOB: 08/25/1991 Today's Date: 01/16/2021    History of present illness Pt is a 30 y.o. male admitted 01/13/21 with reportedly accidental heroin overdose, given narcan by girlfriend and EMS. Had witnessed GTC seizure, ETT 2/13-2/14. Incidental (+) COVID-19. Course complicated by AMS, agitation. PMH includes IVDA (pt reports no use in past 6 months until day of admission).   OT comments  Pt is eager for discharge.  He is able to perform ADLs with supervision, but demonstrates significant cognitive impairment - see comments below.  He is somewhat resistant to education instead being hyper focused on when he is going to discharge.   Follow Up Recommendations  Supervision - Intermittent (drug rehab)    Equipment Recommendations  None recommended by OT    Recommendations for Other Services Other (comment) (drug rehab)    Precautions / Restrictions Precautions Precautions: Fall       Mobility Bed Mobility Overal bed mobility: Independent                Transfers Overall transfer level: Needs assistance Equipment used: None Transfers: Sit to/from [employer] Transfers Sit to Stand: Supervision Stand pivot transfers: Supervision            Balance Overall balance assessment: Needs assistance   Sitting balance-Leahy Scale: Good     Standing balance support: During functional activity Standing balance-Leahy Scale: Fair                             ADL either performed or assessed with clinical judgement   ADL Overall ADL's : Needs assistance/impaired     Grooming: Wash/dry hands;Wash/dry face;Oral care;Supervision/safety;Standing                   Toilet Transfer: Supervision/safety;Ambulation;Comfort height toilet           Functional mobility during ADLs: Supervision/safety       Vision       Perception     Praxis       Cognition Arousal/Alertness: Awake/alert Behavior During Therapy: Flat affect;Impulsive Overall Cognitive Status: Impaired/Different from baseline Area of Impairment: Orientation;Attention;Memory;Following commands;Safety/judgement;Awareness;Problem solving                 Orientation Level: Disoriented to;Situation Current Attention Level: Sustained Memory: Decreased short-term memory Following Commands: Follows one step commands consistently;Follows multi-step commands inconsistently Safety/Judgement: Decreased awareness of deficits;Decreased awareness of safety Awareness: Intellectual Problem Solving: Requires verbal cues;Requires tactile cues General Comments: Pt able to recall friends' family members, but doesn't know why he is in hospital unless cued.  He asks the same questions repeatedly during session with decreased recall of the answer previously provided.  He frequently self distracts requiring cues to redirect to task.  He is eager to discharge, and was somewhat irritable because his discharge wasn't happening as quickly as he would like.  when he was informed of reason for hospitalization he laughed and said "lived to play another day", then said "I suppose I should feel lucky".  He later said "this isn't the first time that's happened.  when asked who was going to provide supervision at home, he was very vague stating a friend of his who helps him out.  He reports a friend is providing tranportation home.        Exercises     Shoulder Instructions       General Comments  Pertinent Vitals/ Pain       Pain Assessment: No/denies pain  Home Living                                          Prior Functioning/Environment              Frequency  Min 2X/week        Progress Toward Goals  OT Goals(current goals can now be found in the care plan section)  Progress towards OT goals: Progressing toward goals     Plan Discharge plan  remains appropriate    Co-evaluation                 AM-PAC OT "6 Clicks" Daily Activity     Outcome Measure   Help from another person eating meals?: None Help from another person taking care of personal grooming?: A Little Help from another person toileting, which includes using toliet, bedpan, or urinal?: A Little Help from another person bathing (including washing, rinsing, drying)?: A Little Help from another person to put on and taking off regular upper body clothing?: A Little Help from another person to put on and taking off regular lower body clothing?: A Little 6 Click Score: 19    End of Session    OT Visit Diagnosis: Cognitive communication deficit (R41.841)   Activity Tolerance Patient tolerated treatment well   Patient Left in bed;with call bell/phone within reach;with bed alarm set   Nurse Communication Mobility status        Time: 2536-6440 OT Time Calculation (min): 11 min  Charges: OT General Charges $OT Visit: 1 Visit OT Treatments $Self Care/Home Management : 8-22 mins  Eber Jones., OTR/L Acute Rehabilitation Services Pager [phone] Office [phone]    Jeani Hawking M 01/16/2021, 2:05 PM

## 2021-01-16 NOTE — Discharge Summary (Signed)
James Li UEA:[id number] DOB: 1991/04/12 DOA: 01/13/2021  PCP: Patient, No Pcp Per  Admit date: 01/13/2021  Discharge date: 01/16/2021  Admitted From: Home  Disposition:  Home - refused rehab   Recommendations for Outpatient Follow-up:   Follow up with PCP in 1-2 weeks  PCP Please obtain BMP/CBC, 2 view CXR in 1week,  (see Discharge instructions)   PCP Please follow up on the following pending results:    Home Health: None   Equipment/Devices: None  Consultations: PCCM Discharge Condition: Stable    CODE STATUS: Full    Diet Recommendation: Heart Healthy   Diet Order            Diet - low sodium heart healthy           Diet regular Room service appropriate? Yes; Fluid consistency: Thin  Diet effective now                  Chief Complaint  Patient presents with  . Drug Overdose     Brief history of present illness from the day of admission and additional interim summary    30 year old male with history of polysubstance abuse in the past who recently did large doses of IV heroin and became unresponsive, was brought to the ER with severe toxic encephalopathy with inability to protect airway, he was seen by ICU team and intubated for airway protection.  He was provided supportive care extubated and transferred to hospitalist service on 01/15/2021.  He was found to have incidental COVID infection.                                                                 Hospital Course    1. Incidental COVID-19 infection in a patient who is unvaccinated. - He is fortunately asymptomatic and appears fine, will monitor closely,  remains symptom-free from the standpoint.   2.  Accidental IV fentanyl drug overdose causing toxic encephalopathy requiring intubation for a day for airway protection.  This  problem has resolved, he has been extensively counseled to refrain from recreational drug abuse, case management and social worker trying to provide him outpatient assistance.  This morning his encephalopathy is much improved and he is at his baseline, oriented x3, alert, he has refused rehab and wants to be discharged home, he is not suicidal homicidal, clearly understands the consequences of indulging in recreational drugs and that he can incur bad infection causing stroke death or disability.  He says he has good support however unable to name any family members that are around, says his mother recently died of breast cancer.  We offered drug rehab assistance but he refused it, social work and case management will trial provide as many outpatient resources as we can provided he is agreeable to following up.  Patient  ambulated in the room without any problems good balance.  Will be discharged home.   Discharge diagnosis     Active Problems:   Toxic encephalopathy    Discharge instructions    Discharge Instructions    Diet - low sodium heart healthy   Complete by: As directed    Discharge instructions   Complete by: As directed    Follow with Primary MD in 7 days   Get CBC, CMP, 2 view Chest X ray -  checked next visit within 1 week by Primary MD   Activity: As tolerated with Full fall precautions use walker/cane & assistance as needed  Disposition Home    Diet: Heart Healthy   Special Instructions: If you have smoked or chewed Tobacco  in the last 2 yrs please stop smoking, stop any regular Alcohol  and or any Recreational drug use.  On your next visit with your primary care physician please Get Medicines reviewed and adjusted.  Please request your Prim.MD to go over all Hospital Tests and Procedure/Radiological results at the follow up, please get all Hospital records sent to your Prim MD by signing hospital release before you go home.  If you experience worsening of your  admission symptoms, develop shortness of breath, life threatening emergency, suicidal or homicidal thoughts you must seek medical attention immediately by calling 911 or calling your MD immediately  if symptoms less severe.  You Must read complete instructions/literature along with all the possible adverse reactions/side effects for all the Medicines you take and that have been prescribed to you. Take any new Medicines after you have completely understood and accpet all the possible adverse reactions/side effects.    Do not drive when taking Pain medications or if under the effect of any drugs.  Do not take more than prescribed Pain, Sleep and Anxiety Medications  Wear Seat belts while driving.   Increase activity slowly   Complete by: As directed    MyChart COVID-19 home monitoring program   Complete by: Jan 16, 2021    Is the patient willing to use the MyChart Mobile App for home monitoring?: Yes   Temperature monitoring   Complete by: Jan 16, 2021    After how many days would you like to receive a notification of this patient's flowsheet entries?: 1      Discharge Medications   Allergies as of 01/16/2021      Reactions   Ibuprofen Rash   Tylenol [acetaminophen] Rash      Medication List    STOP taking these medications   oxyCODONE 5 MG immediate release tablet Commonly known as: Oxy IR/ROXICODONE     TAKE these medications   multivitamin tablet Take 1 tablet by mouth daily.        Follow-up Information    [facility]. Go on 02/06/2021.   Why: at 3:50pm for hospital follow-up with Georgian Co, PA THIS WILL BE A VIRTUAL VISIT Contact information: [address] [place] [facility] [address] [phone]              Major procedures and Radiology Reports - PLEASE review detailed and final reports thoroughly  -       CT Head Wo Contrast  Result Date: 01/13/2021 CLINICAL DATA:  Seizure EXAM: CT HEAD WITHOUT CONTRAST  TECHNIQUE: Contiguous axial images were obtained from the base of the skull through the vertex without intravenous contrast. COMPARISON:  None. FINDINGS: Brain: Ventricles and sulci are normal in size  and configuration. There is no intracranial mass, hemorrhage, extra-axial fluid collection, or midline shift. The brain parenchyma appears unremarkable. There is no evident acute infarct. Vascular: There is no hyperdense vessel. No evident vascular calcification. Skull: Bony calvarium appears intact. Sinuses/Orbits: Mucosal thickening noted in several ethmoid air cells. Orbits appear symmetric bilaterally. Other: Mastoid air cells are clear. IMPRESSION: Brain parenchyma appears unremarkable. No mass or hemorrhage. There is mucosal thickening in several ethmoid air cells. Electronically Signed   By: Bretta Bang III M.D.   On: 01/13/2021 15:37   DG Chest Port 1 View  Result Date: 01/16/2021 CLINICAL DATA:  Shortness of breath.  COVID. EXAM: PORTABLE CHEST 1 VIEW COMPARISON:  01/04/2017. FINDINGS: Mediastinum and hilar structures normal. Heart size stable. Mild peribronchial cuffing. Bronchitis cannot be excluded. No focal infiltrate. No pleural effusion or pneumothorax. Mild thoracic spine scoliosis. No acute bony abnormality. IMPRESSION: Mild peribronchial cuffing. Bronchitis cannot be excluded. Electronically Signed   By: Maisie Fus  Register   On: 01/16/2021 08:15   DG Chest Portable 1 View  Result Date: 01/13/2021 CLINICAL DATA:  Status post intubation. EXAM: PORTABLE CHEST 1 VIEW COMPARISON:  Chest x-ray dated January 04, 2017. FINDINGS: Endotracheal tube tip 4.7 cm above the carina. Enteric tube in the stomach with the tip in the gastric fundus. The heart size and mediastinal contours are within normal limits. Normal pulmonary vascularity. No focal consolidation, pleural effusion, or pneumothorax. No acute osseous abnormality. IMPRESSION: 1. Appropriately positioned endotracheal and enteric tubes. 2. No  active disease. Electronically Signed   By: Obie Dredge M.D.   On: 01/13/2021 14:36    Micro Results     Recent Results (from the past 240 hour(s))  Resp Panel by RT-PCR (Flu A&B, Covid) Nasopharyngeal Swab     Status: Abnormal   Collection Time: 01/13/21  2:35 PM   Specimen: Nasopharyngeal Swab; Nasopharyngeal(NP) swabs in vial transport medium  Result Value Ref Range Status   SARS Coronavirus 2 by RT PCR POSITIVE (A) NEGATIVE Final    Comment: RESULT CALLED TO, READ BACK BY AND VERIFIED WITH: RSusette Racer RN [phone] 01/13/21 D VANHOOK (NOTE) SARS-CoV-2 target nucleic acids are DETECTED.  The SARS-CoV-2 RNA is generally detectable in upper respiratory specimens during the acute phase of infection. Positive results are indicative of the presence of the identified virus, but do not rule out bacterial infection or co-infection with other pathogens not detected by the test. Clinical correlation with patient history and other diagnostic information is necessary to determine patient infection status. The expected result is Negative.  Fact Sheet for Patients: BloggerCourse.com  Fact Sheet for Healthcare Providers: SeriousBroker.it  This test is not yet approved or cleared by the [place] FDA and  has been authorized for detection and/or diagnosis of SARS-CoV-2 by FDA under an Emergency Use Authorization (EUA).  This EUA will remain in effect (meaning this test can b e used) for the duration of  the COVID-19 declaration under Section 564(b)(1) of the Act, 21 U.S.C. section 360bbb-3(b)(1), unless the authorization is terminated or revoked sooner.     Influenza A by PCR NEGATIVE NEGATIVE Final   Influenza B by PCR NEGATIVE NEGATIVE Final    Comment: (NOTE) The Xpert Xpress SARS-CoV-2/FLU/RSV plus assay is intended as an aid in the diagnosis of influenza from Nasopharyngeal swab specimens and should not be used as a sole basis for  treatment. Nasal washings and aspirates are unacceptable for Xpert Xpress SARS-CoV-2/FLU/RSV testing.  Fact Sheet for Patients: BloggerCourse.com  Fact Sheet for  Healthcare Providers: SeriousBroker.it  This test is not yet approved or cleared by the [place] and has been authorized for detection and/or diagnosis of SARS-CoV-2 by FDA under an Emergency Use Authorization (EUA). This EUA will remain in effect (meaning this test can be used) for the duration of the COVID-19 declaration under Section 564(b)(1) of the Act, 21 U.S.C. section 360bbb-3(b)(1), unless the authorization is terminated or revoked.  Performed at [facility], [address] [place], [place] [address]   Culture, blood (routine x 2)     Status: None (Preliminary result)   Collection Time: 01/13/21  5:02 PM   Specimen: BLOOD RIGHT HAND  Result Value Ref Range Status   Specimen Description BLOOD RIGHT HAND  Final   Special Requests   Final    BOTTLES DRAWN AEROBIC AND ANAEROBIC Blood Culture adequate volume   Culture   Final    NO GROWTH 3 DAYS Performed at [facility], [address], [place], [place] [address]    Report Status PENDING  Incomplete  MRSA PCR Screening     Status: None   Collection Time: 01/13/21  6:24 PM   Specimen: Nasopharyngeal  Result Value Ref Range Status   MRSA by PCR NEGATIVE NEGATIVE Final    Comment:        The GeneXpert MRSA Assay (FDA approved for NASAL specimens only), is one component of a comprehensive MRSA colonization surveillance program. It is not intended to diagnose MRSA infection nor to guide or monitor treatment for MRSA infections. Performed at [facility], [address] [place], [place] [address]   Culture, blood (routine x 2)     Status: None (Preliminary result)   Collection Time: 01/13/21  8:00 PM   Specimen: BLOOD  Result Value Ref Range Status   Specimen Description BLOOD  SITE NOT SPECIFIED  Final   Special Requests   Final    BOTTLES DRAWN AEROBIC ONLY Blood Culture results may not be optimal due to an inadequate volume of blood received in culture bottles   Culture   Final    NO GROWTH 3 DAYS Performed at [facility], [address] [place], [place] [address]    Report Status PENDING  Incomplete    Today   Subjective    Fernand Sorbello today has no headache,no chest abdominal pain,no new weakness tingling or numbness, feels much better wants to go home today.    Objective   Blood pressure (!) 114/57, pulse (!) 54, temperature 97.8 F (36.6 C), temperature source Oral, resp. rate 18, height 6' (1.829 m), weight 97.3 kg, SpO2 97 %.   Intake/Output Summary (Last 24 hours) at 01/16/2021 1000 Last data filed at 01/16/2021 0834 Gross per 24 hour  Intake 753.15 ml  Output 1150 ml  Net -396.85 ml    Exam  Awake Alert, No new F.N deficits,  [place].AT,PERRAL Supple Neck,No JVD, No cervical lymphadenopathy appriciated.  Symmetrical Chest wall movement, Good air movement bilaterally, CTAB RRR,No Gallops,Rubs or new Murmurs, No Parasternal Heave +ve B.Sounds, Abd Soft, Non tender, No organomegaly appriciated, No rebound -guarding or rigidity. No Cyanosis, Clubbing or edema, No new Rash or bruise   Data Review   CBC w Diff:  Lab Results  Component Value Date   WBC 4.7 01/16/2021   HGB 12.1 (L) 01/16/2021   HCT 37.2 (L) 01/16/2021   PLT 136 (L) 01/16/2021   LYMPHOPCT 30 01/16/2021   MONOPCT 9 01/16/2021  EOSPCT 1 01/16/2021   BASOPCT 0 01/16/2021    CMP:  Lab Results  Component Value Date   NA 143 01/16/2021   K 3.7 01/16/2021   CL 111 01/16/2021   CO2 24 01/16/2021   BUN <5 (L) 01/16/2021   CREATININE 0.78 01/16/2021   PROT 5.6 (L) 01/16/2021   ALBUMIN 3.4 (L) 01/16/2021   BILITOT 2.8 (H) 01/16/2021   ALKPHOS 48 01/16/2021   AST 57 (H) 01/16/2021   ALT 47 (H) 01/16/2021  . Lab Results  Component Value Date   TSH  2.994 01/16/2021     Total Time in preparing paper work, data evaluation and todays exam - 35 minutes  Susa Raring M.D on 01/16/2021 at 10:00 AM  [facility]

## 2021-01-17 ENCOUNTER — Inpatient Hospital Stay (HOSPITAL_COMMUNITY): Payer: Self-pay

## 2021-01-17 ENCOUNTER — Inpatient Hospital Stay (HOSPITAL_COMMUNITY)
Admission: EM | Admit: 2021-01-17 | Discharge: 2021-01-18 | DRG: 917 | Discharge status: Against Medical Advice | Payer: Self-pay | Attending: Critical Care Medicine | Admitting: Critical Care Medicine

## 2021-01-17 ENCOUNTER — Emergency Department (HOSPITAL_COMMUNITY): Payer: Self-pay

## 2021-01-17 ENCOUNTER — Encounter (HOSPITAL_COMMUNITY): Payer: Self-pay | Admitting: Critical Care Medicine

## 2021-01-17 DIAGNOSIS — Z5329 Procedure and treatment not carried out because of patient's decision for other reasons: Secondary | ICD-10-CM | POA: Diagnosis not present

## 2021-01-17 DIAGNOSIS — Z5900 Homelessness unspecified: Secondary | ICD-10-CM

## 2021-01-17 DIAGNOSIS — E876 Hypokalemia: Secondary | ICD-10-CM | POA: Diagnosis present

## 2021-01-17 DIAGNOSIS — T4275XA Adverse effect of unspecified antiepileptic and sedative-hypnotic drugs, initial encounter: Secondary | ICD-10-CM | POA: Diagnosis present

## 2021-01-17 DIAGNOSIS — T50901A Poisoning by unspecified drugs, medicaments and biological substances, accidental (unintentional), initial encounter: Secondary | ICD-10-CM

## 2021-01-17 DIAGNOSIS — T40601A Poisoning by unspecified narcotics, accidental (unintentional), initial encounter: Principal | ICD-10-CM | POA: Diagnosis present

## 2021-01-17 DIAGNOSIS — J9600 Acute respiratory failure, unspecified whether with hypoxia or hypercapnia: Secondary | ICD-10-CM

## 2021-01-17 DIAGNOSIS — U071 COVID-19: Secondary | ICD-10-CM | POA: Diagnosis present

## 2021-01-17 DIAGNOSIS — R4182 Altered mental status, unspecified: Secondary | ICD-10-CM

## 2021-01-17 DIAGNOSIS — T40411A Poisoning by fentanyl or fentanyl analogs, accidental (unintentional), initial encounter: Secondary | ICD-10-CM | POA: Diagnosis present

## 2021-01-17 DIAGNOSIS — I952 Hypotension due to drugs: Secondary | ICD-10-CM | POA: Diagnosis present

## 2021-01-17 DIAGNOSIS — R404 Transient alteration of awareness: Secondary | ICD-10-CM | POA: Diagnosis present

## 2021-01-17 DIAGNOSIS — J9601 Acute respiratory failure with hypoxia: Secondary | ICD-10-CM | POA: Diagnosis present

## 2021-01-17 DIAGNOSIS — R569 Unspecified convulsions: Secondary | ICD-10-CM | POA: Diagnosis present

## 2021-01-17 DIAGNOSIS — Z9911 Dependence on respirator [ventilator] status: Secondary | ICD-10-CM

## 2021-01-17 DIAGNOSIS — G928 Other toxic encephalopathy: Secondary | ICD-10-CM | POA: Diagnosis present

## 2021-01-17 DIAGNOSIS — Z0189 Encounter for other specified special examinations: Secondary | ICD-10-CM

## 2021-01-17 DIAGNOSIS — F1910 Other psychoactive substance abuse, uncomplicated: Secondary | ICD-10-CM | POA: Diagnosis present

## 2021-01-17 DIAGNOSIS — R739 Hyperglycemia, unspecified: Secondary | ICD-10-CM | POA: Diagnosis present

## 2021-01-17 DIAGNOSIS — R7989 Other specified abnormal findings of blood chemistry: Secondary | ICD-10-CM | POA: Diagnosis present

## 2021-01-17 LAB — URINALYSIS, ROUTINE W REFLEX MICROSCOPIC
Bilirubin Urine: NEGATIVE
Glucose, UA: 50 mg/dL — AB
Ketones, ur: NEGATIVE mg/dL
Leukocytes,Ua: NEGATIVE
Nitrite: NEGATIVE
Protein, ur: 100 mg/dL — AB
RBC / HPF: >50 RBC/hpf — ABNORMAL HIGH (ref 0–5)
Specific Gravity, Urine: 1.009 (ref 1.005–1.030)
pH: 7 (ref 5.0–8.0)

## 2021-01-17 LAB — COMPREHENSIVE METABOLIC PANEL
ALT: 88 U/L — ABNORMAL HIGH (ref 0–44)
AST: 83 U/L — ABNORMAL HIGH (ref 15–41)
Albumin: 3.4 g/dL — ABNORMAL LOW (ref 3.5–5.0)
Alkaline Phosphatase: 58 U/L (ref 38–126)
Anion gap: 12 (ref 5–15)
BUN: 7 mg/dL (ref 6–20)
CO2: 23 mmol/L (ref 22–32)
Calcium: 8 mg/dL — ABNORMAL LOW (ref 8.9–10.3)
Chloride: 107 mmol/L (ref 98–111)
Creatinine, Ser: 1.09 mg/dL (ref 0.61–1.24)
GFR, Estimated: >60 mL/min (ref >60)
Glucose, Bld: 157 mg/dL — ABNORMAL HIGH (ref 70–99)
Potassium: 3.1 mmol/L — ABNORMAL LOW (ref 3.5–5.1)
Sodium: 142 mmol/L (ref 135–145)
Total Bilirubin: 1.8 mg/dL — ABNORMAL HIGH (ref 0.3–1.2)
Total Protein: 5.4 g/dL — ABNORMAL LOW (ref 6.5–8.1)

## 2021-01-17 LAB — I-STAT ARTERIAL BLOOD GAS, ED
Acid-Base Excess: 4 mmol/L — ABNORMAL HIGH (ref 0.0–2.0)
Bicarbonate: 29.1 mmol/L — ABNORMAL HIGH (ref 20.0–28.0)
Calcium, Ion: 1.14 mmol/L — ABNORMAL LOW (ref 1.15–1.40)
HCT: 33 % — ABNORMAL LOW (ref 39.0–52.0)
Hemoglobin: 11.2 g/dL — ABNORMAL LOW (ref 13.0–17.0)
O2 Saturation: 100 %
Patient temperature: 97.8
Potassium: 3.1 mmol/L — ABNORMAL LOW (ref 3.5–5.1)
Sodium: 142 mmol/L (ref 135–145)
TCO2: 31 mmol/L (ref 22–32)
pCO2 arterial: 46.9 mmHg (ref 32.0–48.0)
pH, Arterial: 7.399 (ref 7.350–7.450)
pO2, Arterial: 442 mmHg — ABNORMAL HIGH (ref 83.0–108.0)

## 2021-01-17 LAB — ETHANOL: Alcohol, Ethyl (B): <10 mg/dL (ref <10)

## 2021-01-17 LAB — PROTIME-INR
INR: 1.2 (ref 0.8–1.2)
Prothrombin Time: 14.4 seconds (ref 11.4–15.2)

## 2021-01-17 LAB — RAPID URINE DRUG SCREEN, HOSP PERFORMED
Amphetamines: NOT DETECTED
Barbiturates: NOT DETECTED
Benzodiazepines: POSITIVE — AB
Cocaine: NOT DETECTED
Opiates: NOT DETECTED
Tetrahydrocannabinol: NOT DETECTED

## 2021-01-17 LAB — GLUCOSE, CAPILLARY
Glucose-Capillary: 77 mg/dL (ref 70–99)
Glucose-Capillary: 80 mg/dL (ref 70–99)

## 2021-01-17 LAB — SALICYLATE LEVEL: Salicylate Lvl: <7 mg/dL — ABNORMAL LOW (ref 7.0–30.0)

## 2021-01-17 LAB — ACETAMINOPHEN LEVEL: Acetaminophen (Tylenol), Serum: <10 ug/mL — ABNORMAL LOW (ref 10–30)

## 2021-01-17 MED ORDER — LORAZEPAM 2 MG/ML IJ SOLN
INTRAMUSCULAR | Status: AC
  Administered 2021-01-17: 2 mg via INTRAVENOUS
  Filled 2021-01-17: qty 1

## 2021-01-17 MED ORDER — CHLORHEXIDINE GLUCONATE 0.12% ORAL RINSE (MEDLINE KIT)
15.0000 mL | Freq: Two times a day (BID) | OROMUCOSAL | Status: DC
  Administered 2021-01-17 – 2021-01-18 (×2): 15 mL via OROMUCOSAL

## 2021-01-17 MED ORDER — DOCUSATE SODIUM 50 MG/5ML PO LIQD
100.0000 mg | Freq: Two times a day (BID) | ORAL | Status: DC
  Administered 2021-01-17: 100 mg
  Filled 2021-01-17 (×2): qty 10

## 2021-01-17 MED ORDER — DOCUSATE SODIUM 100 MG PO CAPS: 100.0000 mg | ORAL_CAPSULE | Freq: Two times a day (BID) | ORAL | Status: DC | PRN

## 2021-01-17 MED ORDER — ROCURONIUM BROMIDE 50 MG/5ML IV SOLN
INTRAVENOUS | Status: DC | PRN
  Administered 2021-01-17: 70 mg via INTRAVENOUS

## 2021-01-17 MED ORDER — PROPOFOL 1000 MG/100ML IV EMUL
0.0000 ug/kg/min | INTRAVENOUS | Status: DC
  Administered 2021-01-17: 5 ug/kg/min via INTRAVENOUS
  Administered 2021-01-17: 30 ug/kg/min via INTRAVENOUS
  Administered 2021-01-18 (×3): 50 ug/kg/min via INTRAVENOUS
  Filled 2021-01-17 (×5): qty 100

## 2021-01-17 MED ORDER — PROSOURCE TF PO LIQD
45.0000 mL | Freq: Two times a day (BID) | ORAL | Status: DC
  Administered 2021-01-17: 45 mL
  Filled 2021-01-17: qty 45

## 2021-01-17 MED ORDER — HEPARIN SODIUM (PORCINE) 5000 UNIT/ML IJ SOLN
5000.0000 [IU] | Freq: Three times a day (TID) | INTRAMUSCULAR | Status: DC
  Administered 2021-01-17 – 2021-01-18 (×2): 5000 [IU] via SUBCUTANEOUS
  Filled 2021-01-17 (×2): qty 1

## 2021-01-17 MED ORDER — INSULIN ASPART 100 UNIT/ML ~~LOC~~ SOLN: 1.0000 [IU] | SUBCUTANEOUS | Status: DC

## 2021-01-17 MED ORDER — FENTANYL CITRATE (PF) 100 MCG/2ML IJ SOLN
50.0000 ug | INTRAMUSCULAR | Status: DC | PRN
  Administered 2021-01-18 (×2): 100 ug via INTRAVENOUS
  Filled 2021-01-17 (×2): qty 2

## 2021-01-17 MED ORDER — POLYETHYLENE GLYCOL 3350 17 G PO PACK
17.0000 g | PACK | Freq: Every day | ORAL | Status: DC | PRN
  Filled 2021-01-17: qty 1

## 2021-01-17 MED ORDER — PANTOPRAZOLE SODIUM 40 MG IV SOLR
40.0000 mg | Freq: Every day | INTRAVENOUS | Status: DC
  Administered 2021-01-17: 40 mg via INTRAVENOUS
  Filled 2021-01-17: qty 40

## 2021-01-17 MED ORDER — ENOXAPARIN SODIUM 40 MG/0.4ML ~~LOC~~ SOLN: 40.0000 mg | SUBCUTANEOUS | Status: DC

## 2021-01-17 MED ORDER — POLYETHYLENE GLYCOL 3350 17 G PO PACK
17.0000 g | PACK | Freq: Every day | ORAL | Status: DC
  Administered 2021-01-17: 17 g

## 2021-01-17 MED ORDER — SODIUM CHLORIDE 0.9 % IV SOLN
2000.0000 mg | Freq: Once | INTRAVENOUS | Status: AC
  Administered 2021-01-17: 2000 mg via INTRAVENOUS
  Filled 2021-01-17: qty 20

## 2021-01-17 MED ORDER — ETOMIDATE 2 MG/ML IV SOLN
INTRAVENOUS | Status: AC | PRN
Start: 2021-01-17 — End: 2021-01-17
  Administered 2021-01-17: 20 mg via INTRAVENOUS

## 2021-01-17 MED ORDER — VITAL HIGH PROTEIN PO LIQD
1000.0000 mL | ORAL | Status: DC
  Administered 2021-01-17: 1000 mL

## 2021-01-17 MED ORDER — LACTATED RINGERS IV BOLUS
1000.0000 mL | Freq: Once | INTRAVENOUS | Status: AC
  Administered 2021-01-17: 1000 mL via INTRAVENOUS

## 2021-01-17 MED ORDER — POTASSIUM CHLORIDE 20 MEQ PO PACK
40.0000 meq | PACK | Freq: Once | ORAL | Status: AC
  Administered 2021-01-17: 40 meq
  Filled 2021-01-17: qty 2

## 2021-01-17 MED ORDER — FENTANYL CITRATE (PF) 100 MCG/2ML IJ SOLN
50.0000 ug | INTRAMUSCULAR | Status: DC | PRN
Start: 2021-01-17 — End: 2021-01-18
  Administered 2021-01-18: 50 ug via INTRAVENOUS
  Filled 2021-01-17: qty 2

## 2021-01-17 MED ORDER — DOCUSATE SODIUM 50 MG/5ML PO LIQD: 100.0000 mg | Freq: Two times a day (BID) | ORAL | Status: DC | PRN

## 2021-01-17 MED ORDER — POLYETHYLENE GLYCOL 3350 17 G PO PACK: 17.0000 g | PACK | Freq: Every day | ORAL | Status: DC | PRN

## 2021-01-17 MED ORDER — ORAL CARE MOUTH RINSE
15.0000 mL | OROMUCOSAL | Status: DC
  Administered 2021-01-17 – 2021-01-18 (×5): 15 mL via OROMUCOSAL

## 2021-01-17 MED ORDER — LORAZEPAM 2 MG/ML IJ SOLN: 2.0000 mg | Freq: Once | INTRAMUSCULAR | Status: AC

## 2021-01-17 NOTE — ED Provider Notes (Signed)
[facility] EMERGENCY DEPARTMENT Provider Note  CSN: [id number] Arrival date & time: 01/17/21 1526    History Chief Complaint  Patient presents with  . Drug Overdose    HPI  James Li is a 30 y.o. male with history of substance abuse brought to the ED via EMS for overdose. [place] other residents unresponsive. EMS reports he had pin-point pupils and low RR on their arrival. Given a total of 5mg  Narcan with minimal improvement. He then also had what EMS described as seizure-like activity with decorticate posturing and so given Versed 2.5mg . He has not had any purposeful movement.   Review of EMR shows the patient was admitted for overdose requiring intubation 4 days ago, extubated the following day and ultimately discharged after refusing any kind of drug treatment resources. He did not have any seizure like activity then but was found to be Covid positive incidentally.   Patient is unable to provide any additional history, Level 5 caveat applies.    No past medical history on file.   No family history on file.  Social History   Tobacco Use  . Smoking status: Unknown If Ever Smoked  Substance Use Topics  . Drug use: Yes    Types: IV    Comment: herion     Home Medications Prior to Admission medications   Medication Sig Start Date End Date Taking? Authorizing Provider  Multiple Vitamin (MULTIVITAMIN) tablet Take 1 tablet by mouth daily.    [provider]     Allergies    Ibuprofen and Tylenol [acetaminophen]   Review of Systems   Review of Systems Unable to assess due to mental status.    Physical Exam BP (!) 107/54 (BP Location: Right Arm)   Pulse (!) 55   Temp 99.5 F (37.5 C)   Resp 20   Ht 6' (1.829 m)   Wt 97.3 kg   SpO2 100%   BMI 29.09 kg/m   Physical Exam Vitals and nursing note reviewed.  HENT:     Head: Normocephalic and atraumatic.     Nose: Nose normal.      Mouth/Throat:     Mouth: Mucous membranes are moist.  Eyes:     Conjunctiva/sclera: Conjunctivae normal.     Pupils: Pupils are equal, round, and reactive to light.  Cardiovascular:     Rate and Rhythm: Normal rate.  Pulmonary:     Comments: Decreased respiratory effort Abdominal:     General: Abdomen is flat. There is no distension.     Palpations: Abdomen is soft.  Musculoskeletal:        General: No swelling. Normal range of motion.  Skin:    General: Skin is warm and dry.  Neurological:     Comments: Unresponsive to painful stimuli, no seizure activity seen prior to intubation  Psychiatric:     Comments: Unable to assess      ED Results / Procedures / Treatments   Labs (all labs ordered are listed, but only abnormal results are displayed) Labs Reviewed  COMPREHENSIVE METABOLIC PANEL - Abnormal; Notable for the following components:      Result Value   Potassium 3.1 (*)    Glucose, Bld 157 (*)    Calcium 8.0 (*)    Total Protein 5.4 (*)    Albumin 3.4 (*)    AST 83 (*)    ALT 88 (*)    Total Bilirubin 1.8 (*)  All other components within normal limits  SALICYLATE LEVEL - Abnormal; Notable for the following components:   Salicylate Lvl <7.0 (*)    All other components within normal limits  ACETAMINOPHEN LEVEL - Abnormal; Notable for the following components:   Acetaminophen (Tylenol), Serum <10 (*)    All other components within normal limits  I-STAT ARTERIAL BLOOD GAS, ED - Abnormal; Notable for the following components:   pO2, Arterial 442 (*)    Bicarbonate 29.1 (*)    Acid-Base Excess 4.0 (*)    Potassium 3.1 (*)    Calcium, Ion 1.14 (*)    HCT 33.0 (*)    Hemoglobin 11.2 (*)    All other components within normal limits  CULTURE, BLOOD (ROUTINE X 2)  CULTURE, BLOOD (ROUTINE X 2)  ETHANOL  PROTIME-INR  CBC WITH DIFFERENTIAL/PLATELET  URINALYSIS, ROUTINE W REFLEX MICROSCOPIC  RAPID URINE DRUG SCREEN, HOSP PERFORMED  BLOOD GAS, ARTERIAL  CBC WITH  DIFFERENTIAL/PLATELET  TRIGLYCERIDES    EKG EKG Interpretation  Date/Time:  Thursday January 17 2021 15:27:33 EST Ventricular Rate:  72 PR Interval:    QRS Duration: 99 QT Interval:  624 QTC Calculation: 684 R Axis:   70 Text Interpretation: Sinus rhythm Probable left atrial enlargement Anteroseptal infarct, age indeterminate Prolonged QT interval No old tracing to compare Confirmed by Susy Frizzle [phone]) on 01/17/2021 3:58:13 PM   Radiology CT Head Wo Contrast  Result Date: 01/17/2021 CLINICAL DATA:  Altered mental status EXAM: CT HEAD WITHOUT CONTRAST TECHNIQUE: Contiguous axial images were obtained from the base of the skull through the vertex without intravenous contrast. COMPARISON:  01/13/2021 FINDINGS: Brain: No acute intracranial abnormality. Specifically, no hemorrhage, hydrocephalus, mass lesion, acute infarction, or significant intracranial injury. Vascular: No hyperdense vessel or unexpected calcification. Skull: No acute calvarial abnormality. Sinuses/Orbits: No acute findings. NG tube and endotracheal tube in place. Other: None IMPRESSION: No acute intracranial abnormality. Electronically Signed   By: Charlett Nose M.D.   On: 01/17/2021 16:59   CT Cervical Spine Wo Contrast  Result Date: 01/17/2021 CLINICAL DATA:  Drug overdose, seizure EXAM: CT CERVICAL SPINE WITHOUT CONTRAST TECHNIQUE: Multidetector CT imaging of the cervical spine was performed without intravenous contrast. Multiplanar CT image reconstructions were also generated. COMPARISON:  08/08/2019 FINDINGS: Alignment: Alignment is anatomic. Skull base and vertebrae: No acute fracture. No primary bone lesion or focal pathologic process. Soft tissues and spinal canal: No prevertebral fluid or swelling. No visible canal hematoma. Endotracheal tube and enteric catheter are identified. Disc levels:  No significant spondylosis or facet hypertrophy. Upper chest: Endotracheal tube is identified, terminating at level of  thoracic inlet. Hypoventilatory changes are seen within the dependent upper lobes. Other: Reconstructed images demonstrate no additional findings. IMPRESSION: 1. No acute cervical spine fracture. Electronically Signed   By: Sharlet Salina M.D.   On: 01/17/2021 17:00   DG Chest Portable 1 View  Result Date: 01/17/2021 CLINICAL DATA:  Intubated EXAM: PORTABLE CHEST 1 VIEW COMPARISON:  01/16/2021 FINDINGS: Single frontal view of the chest demonstrates endotracheal tube overlying tracheal air column, tip midway between thoracic inlet and carina. Enteric catheter passes below diaphragm tip excluded by collimation. Cardiac silhouette is enlarged but stable. Lung volumes are diminished. Increased central vascular congestion, with mild bilateral left greater than right perihilar ground-glass airspace disease. No effusion or pneumothorax. IMPRESSION: 1. No complication after intubation. 2. Decreased lung volumes, with increasing central vascular congestion and developing perihilar airspace disease. Favor edema over infection. Electronically Signed   By: Sharlet Salina  M.D.   On: 01/17/2021 15:45   DG Chest Port 1 View  Result Date: 01/16/2021 CLINICAL DATA:  Shortness of breath.  COVID. EXAM: PORTABLE CHEST 1 VIEW COMPARISON:  01/04/2017. FINDINGS: Mediastinum and hilar structures normal. Heart size stable. Mild peribronchial cuffing. Bronchitis cannot be excluded. No focal infiltrate. No pleural effusion or pneumothorax. Mild thoracic spine scoliosis. No acute bony abnormality. IMPRESSION: Mild peribronchial cuffing. Bronchitis cannot be excluded. Electronically Signed   By: Maisie Fus  Register   On: 01/16/2021 08:15    Procedures Procedure Name: Intubation Date/Time: 01/17/2021 3:54 PM Performed by: Pollyann Savoy, MD Pre-anesthesia Checklist: Suction available and Patient being monitored Oxygen Delivery Method: Ambu bag Preoxygenation: Pre-oxygenation with 100% oxygen Induction Type: Rapid  sequence Ventilation: Mask ventilation without difficulty Laryngoscope Size: Glidescope and 4 Grade View: Grade I Tube size: 7.5 mm Number of attempts: 1 Placement Confirmation: ETT inserted through vocal cords under direct vision,  CO2 detector and Breath sounds checked- equal and bilateral Secured at: 26 cm Tube secured with: ETT holder     .Critical Care Performed by: Pollyann Savoy, MD Authorized by: Pollyann Savoy, MD   Critical care provider statement:    Critical care time (minutes):  50   Critical care time was exclusive of:  Separately billable procedures and treating other patients   Critical care was necessary to treat or prevent imminent or life-threatening deterioration of the following conditions:  Respiratory failure and toxidrome   Critical care was time spent personally by me on the following activities:  Discussions with consultants, evaluation of patient's response to treatment, examination of patient, ordering and performing treatments and interventions, ordering and review of laboratory studies, ordering and review of radiographic studies, pulse oximetry, re-evaluation of patient's condition, obtaining history from patient or surrogate and review of old charts   Care discussed with: admitting provider      Medications Ordered in the ED Medications  propofol (DIPRIVAN) 1000 MG/100ML infusion (5 mcg/kg/min  97.3 kg Intravenous New Bag/Given 01/17/21 1551)  etomidate (AMIDATE) injection (20 mg Intravenous Given 01/17/21 1527)  rocuronium (ZEMURON) injection (70 mg Intravenous Given 01/17/21 1527)  potassium chloride (KLOR-CON) packet 40 mEq (has no administration in time range)  docusate sodium (COLACE) capsule 100 mg (has no administration in time range)  polyethylene glycol (MIRALAX / GLYCOLAX) packet 17 g (has no administration in time range)  enoxaparin (LOVENOX) injection 40 mg (has no administration in time range)  pantoprazole (PROTONIX) injection 40  mg (has no administration in time range)  LORazepam (ATIVAN) injection 2 mg (2 mg Intravenous Given 01/17/21 1626)  levETIRAcetam (KEPPRA) 2,000 mg in sodium chloride 0.9 % 250 mL IVPB (0 mg Intravenous Stopped 01/17/21 1703)     MDM Rules/Calculators/A&P MDM Patient with likely recurrent opiate OD now with possible seizure activity vs posturing. He was down for an unknown time in the woods. He was intubated for airway protection. Given Ativan for possible subclinical seizure activity and started on Propofol for sedation and anti-seizure properties. Patient in C-collar by EMS due to scratches on face and hands. Will send for CT head/C-spine.   ED Course  I have reviewed the triage vital signs and the nursing notes.  Pertinent labs & imaging results that were available during my care of the patient were reviewed by me and considered in my medical decision making (see chart for details).  Clinical Course as of 01/17/21 1824  Thu Jan 17, 2021  1624 Patient now having some clonic jerking of LE and  mouth movements concerning for possible seizure activity. Will give additional Ativan, load Keppra 2000mg  and discuss with Neurology.  [CS]  1624 ABG shows normal pH and pCO2. pO2 is elevated, RT to reduce FiO2.  [CS]  1625 CXR images and results reviewed.  [CS]  [id number] Spoke with Dr. Guy FrancoAbser, Neurology who recommends an EEG and if abnormal they will consult.  [CS]  1650 Spoke with Nehemiah SettleBrooke, ICU who is familiar with the patient. Apparently he was having similar seizure-like activity during recent hospitalization but EEG then was neg. She will come evaluate for re-admission.  [CS]  1737 CMP shows mild hypokalemia and increased LFTs from previous. EtOH, APAP and ASA all neg.  [CS]  1823 UA with blood but no signs of infection.  [CS]    Clinical Course User Index [CS] Pollyann SavoySheldon, Dellanira Dillow B, MD    Final Clinical Impression(s) / ED Diagnoses Final diagnoses:  Opiate overdose, accidental or unintentional, initial  encounter [facility](HCC)  Acute respiratory failure, unspecified whether with hypoxia or hypercapnia [facility](HCC)    Rx / DC Orders ED Discharge Orders    None       Pollyann SavoySheldon, Marya Lowden B, MD 01/17/21 1738

## 2021-01-17 NOTE — Progress Notes (Signed)
Patient arrived to 30M at 2000. Patient sedated with Prop turned up to due to waking up. Intubated at this time with ETT 26@Lip  on 100% FiO2. Respiratory contacted to come turn FiO2 back down. Patient is responsive to voice. Patient noted to be in SB sustaining. Skin assessment performed with writer and Irean Hong. 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 Rash noted from electrode stickers. All other skin WDL. Safety measures implemented, suction at bedside, see flow sheet for full assessment. Will continue to monitor at this time.

## 2021-01-17 NOTE — Progress Notes (Signed)
Contacted E-Link, patient HR sustained in 40's on Propofol at 30 mcg.

## 2021-01-17 NOTE — Progress Notes (Signed)
eLink Physician-Brief Progress Note Patient Name: James Li DOB: [birth year] MRN: [id number]   Date of Service  01/17/2021  HPI/Events of Note  Patient with known history of IVDA admitted with coma presumed to be due to drug overdose, with possible hypoxic / ischemic encephalopathy, he was intubated in the ED for airway protection. Heart rate 48-50 on Propofol but BP is stable at 121/60, MAP 77 mmHg.  eICU Interventions  New Patient Evaluation completed. Monitor heart rate and blood pressure for  Now, without intervention.        [name] [name] 01/17/2021, 8:50 PM

## 2021-01-17 NOTE — ED Notes (Signed)
Pt having seizure like activity. MD aware.

## 2021-01-17 NOTE — Progress Notes (Signed)
Pt transported to 3m with no complications. 

## 2021-01-17 NOTE — Progress Notes (Signed)
Spoke to lab. 3 sets of blood cultures ordered in 24 hr period. Will get first set now and Q6hrs till all are collected.

## 2021-01-17 NOTE — ED Notes (Signed)
Patient transported to CT 

## 2021-01-17 NOTE — Code Documentation (Signed)
Ativan given 2mg  Intubation complete. 27 at the lip Bi lateral breath sounds confirmed

## 2021-01-17 NOTE — Progress Notes (Signed)
STAT EEG completed; results pending. Dr Yadav notified. 

## 2021-01-17 NOTE — H&P (Signed)
NAME:  James Li, MRN:  [id number], DOB:  1991/06/11, LOS: 0 ADMISSION DATE:  01/17/2021, CONSULTATION DATE:  01/17/2021 REFERRING MD:  Dr. Bernette Mayers, CHIEF COMPLAINT:  Acute respiratory distress    Brief History:  30yo found down with unknown downtime.  Known history of drug abuse.  Intubated on arrival.  History of Present Illness:  James Li is a 30 y.o. with a PMHx significant for drug abuse who presented to the ED via EMS with suspected overdose.  Patient was [place] the [place] with unknown downtime.  EMS reported pinpoint pupils and low respiratory rate on arrival.  Patient was given Narcan 5mg  with minimal improvement.  Post-Narcan EMS reported possible seizure-like activity with possible decorticate posturing; therefore Versed was also administered.  Of note, patient was seen at this facility 2/14 with similar presentation including suspected seizure in the setting of drug abuse, requiring intubation and EEG.  Patient was also incidentally found to be positive for Covid.  Patient was discharged 2/16 with recommendation for rehab (refused).  Patient was intubated on arrival with vital signs within normal limits.  Lab work on arrival was significant for potassium 3.9, albumin 3.4, AST 83, ALT 88, total bilirubin 1.8, hemoglobin 12.1.  Head CT and cervical spine were negative for acute abnormalities.  Given need for intubation for airway protection in the setting of drug abuse, PCCM was consulted for further management and admission.  Past Medical History:  Polysubstance abuse  Significant Hospital Events:  2/17 Admitted  Consults:  Neuro  Procedures:  ETT 2/17 >>  Significant Diagnostic Tests:   CT Head and C-Spine 2/17 >> negative for acute abnormalities   EEG 2/17 >>  Micro Data:  2/17 BCx >>  Antimicrobials:  None  Interim History / Subjective:    Objective   Blood pressure (!) 107/54, pulse (!) 55, temperature  99.5 F (37.5 C), resp. rate 20, height 6' (1.829 m), weight 97.3 kg, SpO2 100 %.    Vent Mode: PRVC FiO2 (%):  [60 %-100 %] 60 % Set Rate:  [20 bmp] 20 bmp Vt Set:  [620 mL] 620 mL PEEP:  [5 cmH20] 5 cmH20 Plateau Pressure:  [15 cmH20] 15 cmH20   Intake/Output Summary (Last 24 hours) at 01/17/2021 1833 Last data filed at 01/17/2021 1611 Gross per 24 hour  Intake --  Output 250 ml  Net -250 ml   Filed Weights   01/17/21 1641  Weight: 97.3 kg   Examination: General: Young adult male, intubated/sedated, in NAD. HEENT: Anicteric sclera, PERRL. Moist mucous membranes. ETT in place. Neuro: Sedated, minimally responsive. Unresponsive to verbal/tactile stimuli, some withdrawing vs. posturing with pain/noxious stimuli. +Corneal reflexes. Intermittent ?decorticate posturing. CV: S1S2, RRR, no m/g/r. PULM: Breathing even and unlabored on vent (PEEP 5, FiO2 60%). GI: Soft, nondistended, not apparently TTP. Extremities: No significant LE edema noted. Skin: Warm, dry. Multiple abrasions. Multiple erythematous areas with adhesive residue, c/w recent admission/IV sites.  Resolved Hospital Problem list     Assessment & Plan:  Acute hypoxic respiratory failure Found down, on EMS arrival uncertain of O2 sat but reportedly breathing ~2 times/minute. Initial ABG concerning for hypoxia; intubated on arrival to ED.  - Full vent support - VAP bundle - Wean O2 for sat > 90% - Daily WUA/SBT once clinically appropriate/less sedation  Severe toxic encephalopathy likely secondary to drug overdose Seizure-like activity Polysubstance abuse Recent admission 2/13-2/16 for drug overdose (IV Fentanyl). On previous admission, concern for seizure-like activity with  low RR, intubated. This admission, patient found down - EMS administered Narcan/Ativan. CT Head/C-spine NAICA. - Continue Propofol for sedation - Continue Keppra s/p load - Neuro consulted by ED physician - EEG pending - Frequent neuro  checks  Hypotension likely secondary to drug overdose/sedative medication effect - IV fluid resuscitation - ICU hemodynamic monitoring  COVID-19 infection, incidental 2/13 Tested positive for COVID on prior admission 2/13. Reportedly asymptomatic at that time. No treatment initiated. - Continue airborne precautions  Best practice (evaluated daily)  Diet: NPO Pain/Anxiety/Delirium protocol (if indicated): Per protocol VAP protocol (if indicated): In place DVT prophylaxis: SQH GI prophylaxis: PPI Glucose control: N/A Mobility: Bedrest Disposition: ICU  Goals of Care:  Last date of multidisciplinary goals of care discussion: Pending Family and staff present:  Summary of discussion:  Follow up goals of care discussion due: 2/24 Code Status: Full  Labs   CBC: Recent Labs  Lab 01/13/21 1534 01/13/21 1539 01/14/21 0521 01/15/21 0856 01/16/21 0208 01/17/21 1607  WBC 11.4*  --  7.0 5.9 4.7  --   NEUTROABS 10.0*  --   --   --  2.8  --   HGB 13.5 13.3 12.3* 12.5* 12.1* 11.2*  HCT 41.7 39.0 35.6* 36.1* 37.2* 33.0*  MCV 84.6  --  83.2 84.3 85.9  --   PLT 231  --  181 170 136*  --    Basic Metabolic Panel: Recent Labs  Lab 01/13/21 1409 01/13/21 1411 01/13/21 1539 01/13/21 1702 01/14/21 0521 01/15/21 0856 01/16/21 0208 01/17/21 1600 01/17/21 1607  NA 149* 147*   < >  --  142 140 143 142 142  K 5.1 5.1   < >  --  3.3* 3.5 3.7 3.1* 3.1*  CL 106 109  --   --  113* 110 111 107  --   CO2 19*  --   --   --  21* 22 24 23   --   GLUCOSE 240* 224*  --   --  101* 91 83 157*  --   BUN 16 19  --   --  14 6 <5* 7  --   CREATININE 1.27* 1.00  --   --  0.96 0.80 0.78 1.09  --   CALCIUM 10.2  --   --   --  8.0* 8.3* 8.4* 8.0*  --   MG  --   --   --  2.3 1.9 1.9 1.7  --   --   PHOS  --   --   --  1.2* 2.8  --   --   --   --    < > = values in this interval not displayed.   GFR: Estimated Creatinine Clearance: 123.1 mL/min (by C-G formula based on SCr of 1.09 mg/dL). Recent Labs   Lab 01/13/21 1410 01/13/21 1534 01/13/21 1702 01/14/21 0521 01/15/21 0856 01/16/21 0208  PROCALCITON  --   --  <0.10  --   --   --   WBC  --  11.4*  --  7.0 5.9 4.7  LATICACIDVEN >11.0*  --  1.6  --   --   --    Liver Function Tests: Recent Labs  Lab 01/13/21 1409 01/14/21 0521 01/15/21 0856 01/16/21 0208 01/17/21 1600  AST 42* 32 34 57* 83*  ALT 24 18 24  47* 88*  ALKPHOS 74 46 50 48 58  BILITOT 3.0* 3.2* 3.4* 2.8* 1.8*  PROT 7.7 5.2* 5.4* 5.6* 5.4*  ALBUMIN 5.0 3.3* 3.4* 3.4* 3.4*  No results for input(s): LIPASE, AMYLASE in the last 168 hours. No results for input(s): AMMONIA in the last 168 hours.  ABG    Component Value Date/Time   PHART 7.399 01/17/2021 1607   PCO2ART 46.9 01/17/2021 1607   PO2ART 442 (H) 01/17/2021 1607   HCO3 29.1 (H) 01/17/2021 1607   TCO2 31 01/17/2021 1607   O2SAT 100.0 01/17/2021 1607    Coagulation Profile: Recent Labs  Lab 01/17/21 1600  INR 1.2   Cardiac Enzymes: No results for input(s): CKTOTAL, CKMB, CKMBINDEX, TROPONINI in the last 168 hours.  HbA1C: Hgb A1c MFr Bld  Date/Time Value Ref Range Status  01/13/2021 05:02 PM 4.5 (L) 4.8 - 5.6 % Final    Comment:    (NOTE) Pre diabetes:          5.7%-6.4%  Diabetes:              >6.4%  Glycemic control for   <7.0% adults with diabetes    CBG: Recent Labs  Lab 01/15/21 1625 01/15/21 1948 01/15/21 2349 01/16/21 0347 01/16/21 0754  GLUCAP 87 80 93 81 77   Review of Systems:   Unable to obtain history, as patient is intubated/sedated.  Past Medical History:  He,  has no past medical history on file.   Surgical History:  History reviewed. No pertinent surgical history.   Social History:   reports current drug use. Drug: IV.   Family History:  His family history is not on file.   Allergies Allergies  Allergen Reactions  . Ibuprofen Rash  . Tylenol [Acetaminophen] Rash     Home Medications  Prior to Admission medications   Medication Sig Start Date  End Date Taking? Authorizing Provider  Multiple Vitamin (MULTIVITAMIN) tablet Take 1 tablet by mouth daily.    [provider]     Critical care time: 15 minutes   Tim Lair, PA-C [facility] 01/17/21 6:33 PM  Please see [email or web address] for pager details.

## 2021-01-17 NOTE — ED Triage Notes (Signed)
Pt arrives via EMS. Pt found down in the woods. Unknown down time. History of drug abuse.  Pt given in fiend 5 X narcan 2.5 versed  20g LAC 140/70 100% NRB

## 2021-01-17 NOTE — Procedures (Signed)
Patient Name: James Li  MRN: [id number]  Epilepsy Attending: Charlsie Quest  Referring Physician/Provider: Lanae Boast, NP Date: 01/17/2021 Duration: 23.51 mins  Patient history: 29yo M found down, noted to have seizure like activity. EEG to evaluate for seizure  Level of alertness: comatose  AEDs during EEG study: Propofol  Technical aspects: This EEG study was done with scalp electrodes positioned according to the 10-20 International system of electrode placement. Electrical activity was acquired at a sampling rate of 500Hz  and reviewed with a high frequency filter of 70Hz  and a low frequency filter of 1Hz . EEG data were recorded continuously and digitally stored.   Description: EEG showed continuous generalized low amplitude 2-3hz  delta slowing admixed with intermittent 5-6hz  theta slowing. At times 15-18Hz  beta activity was also noted in frontocentral region.  3 to 6 Hz theta-delta slowing. Hyperventilation and photic stimulation were not performed.     ABNORMALITY -Continuous slow, generalized  IMPRESSION: This study is suggestive of severe diffuse encephalopathy, nonspecific etiology but could be related to sedation. No seizures or epileptiform discharges were seen throughout the recording.   Earlyne Feeser 

## 2021-01-17 NOTE — Progress Notes (Signed)
Ventilator patient transported from ED25 to CT2 and back without any complications.

## 2021-01-18 ENCOUNTER — Emergency Department (HOSPITAL_COMMUNITY): Payer: Self-pay

## 2021-01-18 ENCOUNTER — Inpatient Hospital Stay (HOSPITAL_COMMUNITY)
Admission: EM | Admit: 2021-01-18 | Discharge: 2021-01-29 | DRG: 917 | Disposition: E | Payer: Self-pay | Attending: Internal Medicine | Admitting: Internal Medicine

## 2021-01-18 DIAGNOSIS — E876 Hypokalemia: Secondary | ICD-10-CM | POA: Diagnosis not present

## 2021-01-18 DIAGNOSIS — T50904A Poisoning by unspecified drugs, medicaments and biological substances, undetermined, initial encounter: Secondary | ICD-10-CM

## 2021-01-18 DIAGNOSIS — K7200 Acute and subacute hepatic failure without coma: Secondary | ICD-10-CM | POA: Diagnosis not present

## 2021-01-18 DIAGNOSIS — R569 Unspecified convulsions: Secondary | ICD-10-CM | POA: Diagnosis not present

## 2021-01-18 DIAGNOSIS — T40411A Poisoning by fentanyl or fentanyl analogs, accidental (unintentional), initial encounter: Secondary | ICD-10-CM | POA: Diagnosis present

## 2021-01-18 DIAGNOSIS — J9600 Acute respiratory failure, unspecified whether with hypoxia or hypercapnia: Secondary | ICD-10-CM

## 2021-01-18 DIAGNOSIS — T402X1A Poisoning by other opioids, accidental (unintentional), initial encounter: Principal | ICD-10-CM | POA: Diagnosis present

## 2021-01-18 DIAGNOSIS — G936 Cerebral edema: Secondary | ICD-10-CM | POA: Diagnosis not present

## 2021-01-18 DIAGNOSIS — J9690 Respiratory failure, unspecified, unspecified whether with hypoxia or hypercapnia: Secondary | ICD-10-CM

## 2021-01-18 DIAGNOSIS — G9341 Metabolic encephalopathy: Secondary | ICD-10-CM | POA: Diagnosis present

## 2021-01-18 DIAGNOSIS — G253 Myoclonus: Secondary | ICD-10-CM | POA: Diagnosis not present

## 2021-01-18 DIAGNOSIS — J9601 Acute respiratory failure with hypoxia: Secondary | ICD-10-CM | POA: Diagnosis present

## 2021-01-18 DIAGNOSIS — U071 COVID-19: Secondary | ICD-10-CM | POA: Diagnosis present

## 2021-01-18 DIAGNOSIS — Z5900 Homelessness unspecified: Secondary | ICD-10-CM

## 2021-01-18 DIAGNOSIS — I468 Cardiac arrest due to other underlying condition: Secondary | ICD-10-CM | POA: Diagnosis present

## 2021-01-18 DIAGNOSIS — I469 Cardiac arrest, cause unspecified: Secondary | ICD-10-CM | POA: Diagnosis present

## 2021-01-18 DIAGNOSIS — G935 Compression of brain: Secondary | ICD-10-CM | POA: Diagnosis not present

## 2021-01-18 DIAGNOSIS — J988 Other specified respiratory disorders: Secondary | ICD-10-CM

## 2021-01-18 DIAGNOSIS — G931 Anoxic brain damage, not elsewhere classified: Secondary | ICD-10-CM | POA: Diagnosis not present

## 2021-01-18 DIAGNOSIS — Z66 Do not resuscitate: Secondary | ICD-10-CM | POA: Diagnosis not present

## 2021-01-18 DIAGNOSIS — Z886 Allergy status to analgesic agent status: Secondary | ICD-10-CM

## 2021-01-18 LAB — I-STAT CHEM 8, ED
BUN: 8 mg/dL (ref 6–20)
Calcium, Ion: 1.09 mmol/L — ABNORMAL LOW (ref 1.15–1.40)
Chloride: 105 mmol/L (ref 98–111)
Creatinine, Ser: 1 mg/dL (ref 0.61–1.24)
Glucose, Bld: 222 mg/dL — ABNORMAL HIGH (ref 70–99)
HCT: 39 % (ref 39.0–52.0)
Hemoglobin: 13.3 g/dL (ref 13.0–17.0)
Potassium: 3.5 mmol/L (ref 3.5–5.1)
Sodium: 145 mmol/L (ref 135–145)
TCO2: 23 mmol/L (ref 22–32)

## 2021-01-18 LAB — CBC WITH DIFFERENTIAL/PLATELET
Abs Immature Granulocytes: 0.01 10*3/uL (ref 0.00–0.07)
Abs Immature Granulocytes: 0.06 10*3/uL (ref 0.00–0.07)
Basophils Absolute: 0 10*3/uL (ref 0.0–0.1)
Basophils Absolute: 0 10*3/uL (ref 0.0–0.1)
Basophils Relative: 0 %
Basophils Relative: 0 %
Eosinophils Absolute: 0.1 10*3/uL (ref 0.0–0.5)
Eosinophils Absolute: 0.1 10*3/uL (ref 0.0–0.5)
Eosinophils Relative: 1 %
Eosinophils Relative: 1 %
HCT: 32.6 % — ABNORMAL LOW (ref 39.0–52.0)
HCT: 41.6 % (ref 39.0–52.0)
Hemoglobin: 11.6 g/dL — ABNORMAL LOW (ref 13.0–17.0)
Hemoglobin: 13.5 g/dL (ref 13.0–17.0)
Immature Granulocytes: 0 %
Immature Granulocytes: 1 %
Lymphocytes Relative: 32 %
Lymphocytes Relative: 9 %
Lymphs Abs: 1.1 10*3/uL (ref 0.7–4.0)
Lymphs Abs: 2 10*3/uL (ref 0.7–4.0)
MCH: 27.9 pg (ref 26.0–34.0)
MCH: 28.8 pg (ref 26.0–34.0)
MCHC: 32.5 g/dL (ref 30.0–36.0)
MCHC: 35.6 g/dL (ref 30.0–36.0)
MCV: 80.9 fL (ref 80.0–100.0)
MCV: 86 fL (ref 80.0–100.0)
Monocytes Absolute: 0.4 10*3/uL (ref 0.1–1.0)
Monocytes Absolute: 0.5 10*3/uL (ref 0.1–1.0)
Monocytes Relative: 3 %
Monocytes Relative: 8 %
Neutro Abs: 10.4 10*3/uL — ABNORMAL HIGH (ref 1.7–7.7)
Neutro Abs: 3.7 10*3/uL (ref 1.7–7.7)
Neutrophils Relative %: 59 %
Neutrophils Relative %: 86 %
Platelets: 146 10*3/uL — ABNORMAL LOW (ref 150–400)
Platelets: 160 10*3/uL (ref 150–400)
RBC: 4.03 MIL/uL — ABNORMAL LOW (ref 4.22–5.81)
RBC: 4.84 MIL/uL (ref 4.22–5.81)
RDW: 13.4 % (ref 11.5–15.5)
RDW: 13.5 % (ref 11.5–15.5)
WBC: 12.1 10*3/uL — ABNORMAL HIGH (ref 4.0–10.5)
WBC: 6.3 10*3/uL (ref 4.0–10.5)
nRBC: 0 % (ref 0.0–0.2)
nRBC: 0 % (ref 0.0–0.2)

## 2021-01-18 LAB — COMPREHENSIVE METABOLIC PANEL
ALT: 79 U/L — ABNORMAL HIGH (ref 0–44)
AST: 55 U/L — ABNORMAL HIGH (ref 15–41)
Albumin: 3.2 g/dL — ABNORMAL LOW (ref 3.5–5.0)
Alkaline Phosphatase: 51 U/L (ref 38–126)
Anion gap: 10 (ref 5–15)
BUN: 7 mg/dL (ref 6–20)
CO2: 24 mmol/L (ref 22–32)
Calcium: 8.1 mg/dL — ABNORMAL LOW (ref 8.9–10.3)
Chloride: 107 mmol/L (ref 98–111)
Creatinine, Ser: 0.91 mg/dL (ref 0.61–1.24)
GFR, Estimated: >60 mL/min (ref >60)
Glucose, Bld: 94 mg/dL (ref 70–99)
Potassium: 2.8 mmol/L — ABNORMAL LOW (ref 3.5–5.1)
Sodium: 141 mmol/L (ref 135–145)
Total Bilirubin: 1.9 mg/dL — ABNORMAL HIGH (ref 0.3–1.2)
Total Protein: 5.1 g/dL — ABNORMAL LOW (ref 6.5–8.1)

## 2021-01-18 LAB — MAGNESIUM: Magnesium: 1.5 mg/dL — ABNORMAL LOW (ref 1.7–2.4)

## 2021-01-18 LAB — CULTURE, BLOOD (ROUTINE X 2)
Culture: NO GROWTH
Culture: NO GROWTH
Special Requests: ADEQUATE

## 2021-01-18 LAB — I-STAT ARTERIAL BLOOD GAS, ED
Acid-base deficit: 3 mmol/L — ABNORMAL HIGH (ref 0.0–2.0)
Bicarbonate: 23.9 mmol/L (ref 20.0–28.0)
Calcium, Ion: 1.12 mmol/L — ABNORMAL LOW (ref 1.15–1.40)
HCT: 40 % (ref 39.0–52.0)
Hemoglobin: 13.6 g/dL (ref 13.0–17.0)
O2 Saturation: 92 %
Patient temperature: 94.2
Potassium: 3.4 mmol/L — ABNORMAL LOW (ref 3.5–5.1)
Sodium: 143 mmol/L (ref 135–145)
TCO2: 25 mmol/L (ref 22–32)
pCO2 arterial: 45.6 mmHg (ref 32.0–48.0)
pH, Arterial: 7.315 — ABNORMAL LOW (ref 7.350–7.450)
pO2, Arterial: 61 mmHg — ABNORMAL LOW (ref 83.0–108.0)

## 2021-01-18 LAB — POCT I-STAT 7, (LYTES, BLD GAS, ICA,H+H)
Acid-Base Excess: 3 mmol/L — ABNORMAL HIGH (ref 0.0–2.0)
Bicarbonate: 26.6 mmol/L (ref 20.0–28.0)
Calcium, Ion: 1.15 mmol/L (ref 1.15–1.40)
HCT: 31 % — ABNORMAL LOW (ref 39.0–52.0)
Hemoglobin: 10.5 g/dL — ABNORMAL LOW (ref 13.0–17.0)
O2 Saturation: 99 %
Patient temperature: 37.7
Potassium: 2.9 mmol/L — ABNORMAL LOW (ref 3.5–5.1)
Sodium: 144 mmol/L (ref 135–145)
TCO2: 28 mmol/L (ref 22–32)
pCO2 arterial: 36 mmHg (ref 32.0–48.0)
pH, Arterial: 7.478 — ABNORMAL HIGH (ref 7.350–7.450)
pO2, Arterial: 119 mmHg — ABNORMAL HIGH (ref 83.0–108.0)

## 2021-01-18 LAB — CBC
HCT: 31.8 % — ABNORMAL LOW (ref 39.0–52.0)
Hemoglobin: 11.4 g/dL — ABNORMAL LOW (ref 13.0–17.0)
MCH: 29.1 pg (ref 26.0–34.0)
MCHC: 35.8 g/dL (ref 30.0–36.0)
MCV: 81.1 fL (ref 80.0–100.0)
Platelets: 168 10*3/uL (ref 150–400)
RBC: 3.92 MIL/uL — ABNORMAL LOW (ref 4.22–5.81)
RDW: 13.6 % (ref 11.5–15.5)
WBC: 5.7 10*3/uL (ref 4.0–10.5)
nRBC: 0 % (ref 0.0–0.2)

## 2021-01-18 LAB — ABO/RH: ABO/RH(D): O POS

## 2021-01-18 LAB — TRIGLYCERIDES: Triglycerides: 58 mg/dL (ref <150)

## 2021-01-18 LAB — GLUCOSE, CAPILLARY
Glucose-Capillary: 90 mg/dL (ref 70–99)
Glucose-Capillary: 89 mg/dL (ref 70–99)

## 2021-01-18 LAB — PHOSPHORUS: Phosphorus: 2.4 mg/dL — ABNORMAL LOW (ref 2.5–4.6)

## 2021-01-18 LAB — PROTIME-INR
INR: 1.1 (ref 0.8–1.2)
Prothrombin Time: 14.1 seconds (ref 11.4–15.2)

## 2021-01-18 MED ORDER — POTASSIUM CHLORIDE 10 MEQ/100ML IV SOLN: 10.0000 meq | INTRAVENOUS | Status: DC

## 2021-01-18 MED ORDER — POTASSIUM CHLORIDE CRYS ER 20 MEQ PO TBCR: 40.0000 meq | EXTENDED_RELEASE_TABLET | Freq: Once | ORAL | Status: DC

## 2021-01-18 MED ORDER — ETOMIDATE 2 MG/ML IV SOLN
INTRAVENOUS | Status: AC | PRN
  Administered 2021-01-18: 20 mg via INTRAVENOUS

## 2021-01-18 MED ORDER — LORAZEPAM 2 MG/ML IJ SOLN: 2.0000 mg | INTRAMUSCULAR | Status: DC | PRN

## 2021-01-18 MED ORDER — PROPOFOL 1000 MG/100ML IV EMUL
5.0000 ug/kg/min | INTRAVENOUS | Status: DC
  Administered 2021-01-18: 10 ug/kg/min via INTRAVENOUS
  Administered 2021-01-19: 60 ug/kg/min via INTRAVENOUS
  Administered 2021-01-19: 25 ug/kg/min via INTRAVENOUS
  Administered 2021-01-19 (×2): 60 ug/kg/min via INTRAVENOUS
  Administered 2021-01-19: 35 ug/kg/min via INTRAVENOUS
  Administered 2021-01-20 (×3): 30 ug/kg/min via INTRAVENOUS
  Administered 2021-01-20: 60 ug/kg/min via INTRAVENOUS
  Administered 2021-01-20: 30 ug/kg/min via INTRAVENOUS
  Administered 2021-01-20: 60 ug/kg/min via INTRAVENOUS
  Administered 2021-01-21: 25 ug/kg/min via INTRAVENOUS
  Administered 2021-01-21: 10 ug/kg/min via INTRAVENOUS
  Administered 2021-01-21: 30 ug/kg/min via INTRAVENOUS
  Administered 2021-01-21: 25 ug/kg/min via INTRAVENOUS
  Administered 2021-01-21 – 2021-01-22 (×2): 10 ug/kg/min via INTRAVENOUS
  Filled 2021-01-18 (×7): qty 100
  Filled 2021-01-18: qty 300
  Filled 2021-01-18 (×5): qty 100
  Filled 2021-01-18: qty 200

## 2021-01-18 MED ORDER — ADULT MULTIVITAMIN W/MINERALS CH: 1.0000 | ORAL_TABLET | Freq: Every day | ORAL | Status: DC

## 2021-01-18 MED ORDER — ROCURONIUM BROMIDE 50 MG/5ML IV SOLN
INTRAVENOUS | Status: AC | PRN
  Administered 2021-01-18: 80 mg via INTRAVENOUS

## 2021-01-18 MED ORDER — CHLORHEXIDINE GLUCONATE CLOTH 2 % EX PADS: 6.0000 | MEDICATED_PAD | Freq: Every day | CUTANEOUS | Status: DC

## 2021-01-18 MED ORDER — SODIUM CHLORIDE 0.9 % IV BOLUS
1000.0000 mL | Freq: Once | INTRAVENOUS | Status: AC
  Administered 2021-01-18: 1000 mL via INTRAVENOUS

## 2021-01-18 MED ORDER — FENTANYL CITRATE (PF) 100 MCG/2ML IJ SOLN: 50.0000 ug | INTRAMUSCULAR | Status: DC | PRN

## 2021-01-18 MED ORDER — MAGNESIUM SULFATE 4 GM/100ML IV SOLN
4.0000 g | Freq: Once | INTRAVENOUS | Status: DC
  Filled 2021-01-18: qty 100

## 2021-01-18 NOTE — Progress Notes (Signed)
Date: 02-06-21 Patient: James Li Admitted: 01/17/2021  3:26 PM Attending Provider: Coralyn Helling, MD  Perrin Maltese has made the decision for the patient to leave the intensive care unit against the advice of Dr. Craige Cotta.  He has been informed and understands the inherent risks, including death.  He has decided to accept the responsibility for this decision. Perrin Maltese and all necessary parties have been advised that he may return to the emergency department for further evaluation or treatment. His condition at time of discharge was fair.  Perrin Maltese had current vital signs as follows:  Blood pressure 118/63, pulse (!) 42, temperature 99.32 F (37.4 C), resp. rate 20, height 6' (1.829 m), weight 97.8 kg, SpO2 99 %.   Ashawn Rinehart has signed the Leaving Against Medical Advice form prior to leaving the department.  Helyn Numbers, RN 06-Feb-2021  RN note: Pt now extubated and A&Ox4 stating he wants to leave. Multiple conversations had with the patient explaining the importance of staying and the dangers of leaving. MD aware.

## 2021-01-18 NOTE — Progress Notes (Signed)
NAME:  James Li, MRN:  [id number], DOB:  November 29, 1991, LOS: 1 ADMISSION DATE:  01/17/2021, CONSULTATION DATE:  01/17/2021 REFERRING MD:  Dr. Bernette Mayers, CHIEF COMPLAINT:  Acute respiratory distress    Brief History:  30 yo male with hx of drug abuse [place] un[place] homeless [place] with unknown downtime.  Noted to have pinpoint pupils and received narcan.  Had possible seizure and given versed.  Required intubation for airway protection.    Past Medical History:  Polysubstance abuse  Significant Hospital Events:  2/17 Admitted  Consults:    Procedures:  ETT 2/17 >> 2/18  Significant Diagnostic Tests:   EEG 2/17 >> generalized slowing  CT head 2/17 >> no acute findings  Micro Data:  COVID PCR 2/13 >> Positive  Antimicrobials:    Interim History / Subjective:  Pressure support.  Objective   Blood pressure 118/63, pulse (!) 42, temperature 99.32 F (37.4 C), resp. rate 20, height 6' (1.829 m), weight 97.8 kg, SpO2 98 %.    Vent Mode: PRVC FiO2 (%):  [30 %-100 %] 30 % Set Rate:  [20 bmp] 20 bmp Vt Set:  [017 mL] 620 mL PEEP:  [5 cmH20] 5 cmH20 Plateau Pressure:  [14 cmH20-15 cmH20] 14 cmH20   Intake/Output Summary (Last 24 hours) at 2021/02/02 0835 Last data filed at Feb 02, 2021 0700 Gross per 24 hour  Intake 311.02 ml  Output 1060 ml  Net -748.98 ml   Filed Weights   01/17/21 1641 01/17/21 2000 02/02/2021 0500  Weight: 97.3 kg 98.1 kg 97.8 kg   Examination:  General - sedated Eyes - pupils reactive ENT - ETT in place Cardiac - regular rate/rhythm, no murmur Chest - equal breath sounds b/l, no wheezing or rales Abdomen - soft, non tender, + bowel sounds Extremities - no cyanosis, clubbing, or edema Skin - no rashes Neuro - RASS -1, moves extremities, follows commands  Resolved Hospital Problem list   Hypotension from sedation  Assessment & Plan:   Acute metabolic encephalopathy likely from accidental overdose with hx of substance  abuse. Possible seizure. - prn ativan, fentanyl  Compromised airway in setting of overdose. - proceed with extubation 2/18  COVID 19 positive. - from 01/13/21 - airborne precautions - no therapy at this time  Hypokalemia, hypomagnesemia. - f/u BMET, Mg  Elevated LFTs. - f/u labs  Social determinants of health. - consult social work to help with substance abuse programs  Medical sales representative (evaluated daily)  Diet: regular DVT prophylaxis: SQH GI prophylaxis: PPI Mobility: as tolerated Disposition: ICU  Goals of Care:  Last date of multidisciplinary goals of care discussion: Pending Family and staff present:  Summary of discussion:  Follow up goals of care discussion due: 2/24 Code Status: Full  Labs    CMP Latest Ref Rng & Units 02-Feb-2021 02-02-2021 01/17/2021  Glucose 70 - 99 mg/dL 94 - -  BUN 6 - 20 mg/dL 7 - -  Creatinine 7.93 - 1.24 mg/dL 9.03 - -  Sodium 009 - 145 mmol/L 141 144 142  Potassium 3.5 - 5.1 mmol/L 2.8(L) 2.9(L) 3.1(L)  Chloride 98 - 111 mmol/L 107 - -  CO2 22 - 32 mmol/L 24 - -  Calcium 8.9 - 10.3 mg/dL 8.1(L) - -  Total Protein 6.5 - 8.1 g/dL 5.1(L) - -  Total Bilirubin 0.3 - 1.2 mg/dL 2.3(R) - -  Alkaline Phos 38 - 126 U/L 51 - -  AST 15 - 41 U/L 55(H) - -  ALT 0 - 44  U/L 79(H) - -    CBC Latest Ref Rng & Units 2021-01-28 28-Jan-2021 01/17/2021  WBC 4.0 - 10.5 K/uL 5.7 - 6.3  Hemoglobin 13.0 - 17.0 g/dL 11.4(L) 10.5(L) 11.6(L)  Hematocrit 39.0 - 52.0 % 31.8(L) 31.0(L) 32.6(L)  Platelets 150 - 400 K/uL 168 - 160    ABG    Component Value Date/Time   PHART 7.478 (H) 2021/01/28 0415   PCO2ART 36.0 January 28, 2021 0415   PO2ART 119 (H) 01/28/2021 0415   HCO3 26.6 Jan 28, 2021 0415   TCO2 28 01-28-21 0415   O2SAT 99.0 Jan 28, 2021 0415    CBG (last 3)  Recent Labs    01/17/21 2352 Jan 28, 2021 0356 2021/01/28 0712  GLUCAP 80 90 89    Critical care time: 33 minutes  Coralyn Helling, MD [facility]/Critical Care Pager - [phone] 01/28/21, 8:47 AM

## 2021-01-18 NOTE — ED Notes (Signed)
Jesusita Oka, father, [phone] would like to speak to RN

## 2021-01-18 NOTE — Discharge Summary (Signed)
Name: James Li MRN: [id number] DOB: [birth year] Admit date: 01/17/2021 Discharge date: 01/27/2021  History: 30 yo male with hx of drug abuse found unresponsive near a homeless camp with unknown downtime.  Noted to have pinpoint pupils and received narcan.  Had possible seizure and given versed.  Required intubation for airway protection.  Extubated on 01/06/2021.  After extubation he decided to leave hospital against medical advice.  Test findings:  EEG 2/17 >> generalized slowing  CT head 2/17 >> no acute findings  Discharge diagnoses: Acute metabolic encephalopathy likely from accidental overdose with hx of substance abuse. Possible seizure. Compromised airway in setting of overdose. COVID 19 positive from 01/13/21 Hypokalemia, hypomagnesemia. Elevated LFTs. Hypotension from sedation  Coralyn Helling, MD [facility]/Critical Care Pager - [phone] 01/23/2021, 11:36 AM

## 2021-01-18 NOTE — ED Provider Notes (Signed)
[facility] EMERGENCY DEPARTMENT Provider Note   CSN: [id number] Arrival date & time: 01/28/2021  2214     History Chief Complaint: cardiac arrest, overdose  Dyon Rotert is a 30 y.o. male with h/o drug abuse and recently left AMA from [facility] ICU yesterday for overdose who presents to the ED via EMS from homeless [place] for overdose and cardiac arrest. Patient reportedly overdosed on IV fentanyl earlier tonight and found unresponsive in a tent. Police gave 2x IM injections of Narcan without improvement of mental status. EMS arrived on scene and found patient pulseless and began CPR. ROSC achieved after approximately 15 minutes of CPR and 1 round of epi. LPA placed by EMS and patient breathing spontaneously on arrival but still unresponsive.  The history is provided by the patient, the EMS personnel and medical records. The history is limited by the condition of the patient.  Cardiac Arrest Witnessed by:  Bystander Incident location: homeless camp. Time since incident: just prior to arrival. Time before BLS initiated: unknown. Time before ALS initiated: unknown. Condition upon EMS arrival:  Unresponsive and agonal respirations Pulse:  Absent Initial cardiac rhythm per EMS:  PEA Treatments prior to arrival:  ACLS protocol and vascular access Medications given prior to ED:  Epinephrine IV access type:  Intraosseous Airway:  Oropharyngeal and spontaneous respirations Rhythm on admission to ED:  Normal sinus Risk factors: drug overdose        No past medical history on file.  Patient Active Problem List   Diagnosis Date Noted  . Cardiac arrest (HCC) 01/19/2021  . Acute respiratory failure with hypoxia (HCC) 01/17/2021  . Toxic encephalopathy 01/13/2021    No past surgical history on file.     No family history on file.  Social History   Tobacco Use  . Smoking status: Unknown If Ever Smoked  Substance Use Topics  . Drug use: Yes    Types:  IV    Comment: herion    Home Medications Prior to Admission medications   Medication Sig Start Date End Date Taking? Authorizing Provider  Multiple Vitamin (MULTIVITAMIN) tablet Take 1 tablet by mouth daily.    [provider]    Allergies    Ibuprofen and Tylenol [acetaminophen]  Review of Systems   Review of Systems  Unable to perform ROS: Patient unresponsive    Physical Exam Updated Vital Signs BP (!) 155/95   Pulse (!) 47   Temp (!) 95.9 F (35.5 C)   Resp (!) 23   SpO2 100%   Physical Exam Vitals and nursing note reviewed.  Constitutional:      General: He is in acute distress.     Appearance: He is ill-appearing and toxic-appearing.     Comments: Unresponsive with OPA in place  HENT:     Head: Normocephalic and atraumatic.     Right Ear: External ear normal.     Left Ear: External ear normal.     Nose: Nose normal.     Mouth/Throat:     Mouth: Mucous membranes are moist.     Pharynx: Oropharynx is clear. No oropharyngeal exudate or posterior oropharyngeal erythema.  Eyes:     General: No scleral icterus.       Right eye: No discharge.        Left eye: No discharge.     Conjunctiva/sclera: Conjunctivae normal.     Pupils: Pupils are equal, round, and reactive to light.  Cardiovascular:     Rate and  Rhythm: Normal rate and regular rhythm.     Pulses: Normal pulses.     Heart sounds: Normal heart sounds.  Pulmonary:     Effort: Pulmonary effort is normal.     Breath sounds: Normal breath sounds. No wheezing, rhonchi or rales.     Comments: Breathing spontaneously with lungs CTAB Abdominal:     General: Abdomen is flat. There is no distension.     Palpations: Abdomen is soft.     Tenderness: There is no guarding or rebound.     Comments: Small scattered bruises to RLQ  Genitourinary:    Penis: Normal.      Testes: Normal.  Musculoskeletal:        General: No signs of injury.     Right lower leg: No edema.     Left lower leg: No edema.   Skin:    General: Skin is cool and dry.     Coloration: Skin is pale.     ED Results / Procedures / Treatments   Labs (all labs ordered are listed, but only abnormal results are displayed) Labs Reviewed  COMPREHENSIVE METABOLIC PANEL - Abnormal; Notable for the following components:      Result Value   CO2 21 (*)    Glucose, Bld 228 (*)    Calcium 7.8 (*)    Total Protein 5.6 (*)    Albumin 3.4 (*)    AST 73 (*)    ALT 91 (*)    Total Bilirubin 2.1 (*)    All other components within normal limits  ACETAMINOPHEN LEVEL - Abnormal; Notable for the following components:   Acetaminophen (Tylenol), Serum <10 (*)    All other components within normal limits  LACTIC ACID, PLASMA - Abnormal; Notable for the following components:   Lactic Acid, Venous 3.5 (*)    All other components within normal limits  SALICYLATE LEVEL - Abnormal; Notable for the following components:   Salicylate Lvl <7.0 (*)    All other components within normal limits  CBC WITH DIFFERENTIAL/PLATELET - Abnormal; Notable for the following components:   WBC 12.1 (*)    Platelets 146 (*)    Neutro Abs 10.4 (*)    All other components within normal limits  URINALYSIS, ROUTINE W REFLEX MICROSCOPIC - Abnormal; Notable for the following components:   APPearance HAZY (*)    Glucose, UA 150 (*)    Hgb urine dipstick MODERATE (*)    Ketones, ur 5 (*)    Protein, ur 100 (*)    RBC / HPF >50 (*)    Bacteria, UA RARE (*)    All other components within normal limits  I-STAT CHEM 8, ED - Abnormal; Notable for the following components:   Glucose, Bld 222 (*)    Calcium, Ion 1.09 (*)    All other components within normal limits  I-STAT ARTERIAL BLOOD GAS, ED - Abnormal; Notable for the following components:   pH, Arterial 7.315 (*)    pO2, Arterial 61 (*)    Acid-base deficit 3.0 (*)    Potassium 3.4 (*)    Calcium, Ion 1.12 (*)    All other components within normal limits  TROPONIN I (HIGH SENSITIVITY) - Abnormal;  Notable for the following components:   Troponin I (High Sensitivity) 19 (*)    All other components within normal limits  CULTURE, BLOOD (ROUTINE X 2)  CULTURE, BLOOD (ROUTINE X 2)  ETHANOL  BRAIN NATRIURETIC PEPTIDE  PROTIME-INR  RAPID URINE DRUG SCREEN, HOSP  PERFORMED  LACTIC ACID, PLASMA  BASIC METABOLIC PANEL  BASIC METABOLIC PANEL  PROTIME-INR  APTT  BLOOD GAS, ARTERIAL  BLOOD GAS, ARTERIAL  CBC  CBC  BLOOD GAS, ARTERIAL  BASIC METABOLIC PANEL  PHOSPHORUS  MAGNESIUM  CBG MONITORING, ED  TYPE AND SCREEN  ABO/RH  TROPONIN I (HIGH SENSITIVITY)    EKG EKG Interpretation  Date/Time:  Friday January 18 2021 22:19:58 EST Ventricular Rate:  69 PR Interval:    QRS Duration: 104 QT Interval:  442 QTC Calculation: 474 R Axis:   31 Text Interpretation: Sinus rhythm Minimal ST depression, lateral leads Borderline prolonged QT interval When compared with ECG of 01/17/2021, QT has shortened Nonspecific T wave abnormality has improved Confirmed by Dione Booze ([id number]) on 01/19/2021 12:04:30 AM   Radiology DG Abd 1 View  Result Date: 01/17/2021 CLINICAL DATA:  NG tube placement EXAM: ABDOMEN - 1 VIEW COMPARISON:  None. FINDINGS: Esophageal tube tip overlies the first portion of duodenum. Nonspecific mild increased bowel gas without obstructive pattern IMPRESSION: Esophageal tube tip overlies the first portion of duodenum. Electronically Signed   By: Jasmine Pang M.D.   On: 01/17/2021 21:45   CT Head Wo Contrast  Result Date: 01/17/2021 CLINICAL DATA:  Altered mental status EXAM: CT HEAD WITHOUT CONTRAST TECHNIQUE: Contiguous axial images were obtained from the base of the skull through the vertex without intravenous contrast. COMPARISON:  01/13/2021 FINDINGS: Brain: No acute intracranial abnormality. Specifically, no hemorrhage, hydrocephalus, mass lesion, acute infarction, or significant intracranial injury. Vascular: No hyperdense vessel or unexpected calcification. Skull: No  acute calvarial abnormality. Sinuses/Orbits: No acute findings. NG tube and endotracheal tube in place. Other: None IMPRESSION: No acute intracranial abnormality. Electronically Signed   By: Charlett Nose M.D.   On: 01/17/2021 16:59   CT Cervical Spine Wo Contrast  Result Date: 01/17/2021 CLINICAL DATA:  Drug overdose, seizure EXAM: CT CERVICAL SPINE WITHOUT CONTRAST TECHNIQUE: Multidetector CT imaging of the cervical spine was performed without intravenous contrast. Multiplanar CT image reconstructions were also generated. COMPARISON:  08/08/2019 FINDINGS: Alignment: Alignment is anatomic. Skull base and vertebrae: No acute fracture. No primary bone lesion or focal pathologic process. Soft tissues and spinal canal: No prevertebral fluid or swelling. No visible canal hematoma. Endotracheal tube and enteric catheter are identified. Disc levels:  No significant spondylosis or facet hypertrophy. Upper chest: Endotracheal tube is identified, terminating at level of thoracic inlet. Hypoventilatory changes are seen within the dependent upper lobes. Other: Reconstructed images demonstrate no additional findings. IMPRESSION: 1. No acute cervical spine fracture. Electronically Signed   By: Sharlet Salina M.D.   On: 01/17/2021 17:00   DG Chest Portable 1 View  Result Date: 01/27/2021 CLINICAL DATA:  Post resuscitation.  Ventilator support. EXAM: PORTABLE CHEST 1 VIEW COMPARISON:  01/17/2021 FINDINGS: Endotracheal tube tip 5 cm above the carina. Orogastric or nasogastric tube tip just within the stomach. Side hole is at the distal thoracic esophagus level. Worsened perihilar edema pattern. IMPRESSION: 1. Endotracheal tube tip 5 cm above the carina. 2. Orogastric or nasogastric tube tip just within the stomach. Side hole at the distal thoracic esophagus level. 3. Worsened perihilar edema pattern. Electronically Signed   By: Paulina Fusi M.D.   On: 01/09/2021 23:14   DG Chest Portable 1 View  Result Date:  01/17/2021 CLINICAL DATA:  Intubated EXAM: PORTABLE CHEST 1 VIEW COMPARISON:  01/16/2021 FINDINGS: Single frontal view of the chest demonstrates endotracheal tube overlying tracheal air column, tip midway between thoracic inlet and carina. Enteric  catheter passes below diaphragm tip excluded by collimation. Cardiac silhouette is enlarged but stable. Lung volumes are diminished. Increased central vascular congestion, with mild bilateral left greater than right perihilar ground-glass airspace disease. No effusion or pneumothorax. IMPRESSION: 1. No complication after intubation. 2. Decreased lung volumes, with increasing central vascular congestion and developing perihilar airspace disease. Favor edema over infection. Electronically Signed   By: Sharlet Salina M.D.   On: 01/17/2021 15:45   EEG adult  Result Date: 01/17/2021 Charlsie Quest, MD     01/17/2021  6:24 PM Patient Name: Tatsuya Okray MRN: [id number] Epilepsy Attending: Charlsie Quest Referring Physician/Provider: Lanae Boast, NP Date: 01/17/2021 Duration: 23.51 mins Patient history: 29yo M found down, noted to have seizure like activity. EEG to evaluate for seizure Level of alertness: comatose AEDs during EEG study: Propofol Technical aspects: This EEG study was done with scalp electrodes positioned according to the 10-20 International system of electrode placement. Electrical activity was acquired at a sampling rate of 500Hz  and reviewed with a high frequency filter of 70Hz  and a low frequency filter of 1Hz . EEG data were recorded continuously and digitally stored. Description: EEG showed continuous generalized low amplitude 2-3hz  delta slowing admixed with intermittent 5-6hz  theta slowing. At times 15-18Hz  beta activity was also noted in frontocentral region.  3 to 6 Hz theta-delta slowing. Hyperventilation and photic stimulation were not performed.   ABNORMALITY -Continuous slow, generalized IMPRESSION: This study is suggestive of  severe diffuse encephalopathy, nonspecific etiology but could be related to sedation. No seizures or epileptiform discharges were seen throughout the recording.    Procedures Procedure Name: Intubation Date/Time: 01/16/2021 10:27 PM Performed by: , MD Pre-anesthesia Checklist: Patient identified, Emergency Drugs available, Suction available, Timeout performed and Patient being monitored Oxygen Delivery Method: Ambu bag Preoxygenation: Pre-oxygenation with 100% oxygen Induction Type: Rapid sequence Ventilation: Oral airway inserted - appropriate to patient size Laryngoscope Size: Glidescope and 4 Grade View: Grade I Tube size: 7.5 mm Number of attempts: 1 Airway Equipment and Method: Stylet and Video-laryngoscopy Placement Confirmation: ETT inserted through vocal cords under direct vision,  Positive ETCO2,  CO2 detector and Breath sounds checked- equal and bilateral Secured at: 25 cm Tube secured with: ETT holder Dental Injury: Teeth and Oropharynx as per pre-operative assessment        Medications Ordered in ED Medications  propofol (DIPRIVAN) 1000 MG/100ML infusion (10 mcg/kg/min  97.8 kg Intravenous New Bag/Given 01/17/2021 2230)  norepinephrine (LEVOPHED) 4mg  in 01/20/2021 premix infusion (0 mcg/min Intravenous Not Given 01/19/21 0057)  heparin injection 5,000 Units (has no administration in time range)  0.9 %  sodium chloride infusion ( Intravenous New Bag/Given 01/19/21 0046)  pantoprazole (PROTONIX) injection 40 mg (40 mg Intravenous Given 01/19/21 0043)  sodium chloride 0.9 % bolus 1,000 mL (1,000 mLs Intravenous New Bag/Given 01/28/2021 2322)  etomidate (AMIDATE) injection (20 mg Intravenous Given 01/17/2021 2224)  rocuronium (ZEMURON) injection (80 mg Intravenous Given 01/10/2021 2224)  aspirin suppository 300 mg (300 mg Rectal Given 01/19/21 0037)    ED Course  I have reviewed the triage vital signs and the nursing notes.  Pertinent labs & imaging results  that were available during my care of the patient were reviewed by me and considered in my medical decision making (see chart for details).    MDM Rules/Calculators/A&P                          Patient 29yoM with  history and physical as described who presents to ED for suspected drug overdose and post-cardiac arrest. On arrival, OPA in place with appropriate ETCO2 on monitor and lungs CTAB. Peripheral IV access obtained. RSI performed and patient successfully intubated for airway protection. Patient placed on propofol infusion for sedation. Patient normotensive and not requiring pressor support. Head CT, CXR, ECG, and labs collected. Suspect cardiac arrest likely 2/2 to hypoxic respiratory failure in setting of opioid drug overdose. Labs notable for lactic acid 3.5, otherwise reassuring. Discussed patient with intensivist in ED and will admit to MICU for further care and management. Patient otherwise remained HDS with no further acute events under my care.  Final Clinical Impression(s) / ED Diagnoses Final diagnoses:  Drug overdose, undetermined intent, initial encounter  Acute respiratory failure, unspecified whether with hypoxia or hypercapnia [facility])    Rx / DC Orders ED Discharge Orders    None       Tonia Brooms, MD 01/19/21 Margarito Courser    Pricilla Loveless, MD 01/19/21 1740

## 2021-01-18 NOTE — ED Triage Notes (Signed)
Pt arrives to ED BIB [facility] as a Post CPR. Per EMS [place] woods and was told by pt's friend that pt OD on Fentanyl. Per EMS 2 of Narcan was administered by [employer]. Per EMS CPR initiated after pt was PEA lasting . 1 round of EPI administered by EMS.   BP 130/56 HR 154 CBG 113

## 2021-01-18 NOTE — Progress Notes (Signed)
Patient resting in bed with eyes closed near end of shift. Patient continues on ventilation with PRVC Fio2 @ 30%. ETT 7.5, 27 @ Lip. Patient sedated on propofol, Does awaken when care is provided and needs fentanyl push's when awake due to pulling at tubes and trying to sit up to reach ETT. Patient is able to follow commands when awake as in squeeze hands or lift leg also nods appropriately. However patient agitated and wanting tube out.   No seizure like activity noted during this shift.   NG tube in right nare with vital HP infusing via pump at 40 ml/hr per order.   Foley in place with total output 800 ml this shift.   No BM noted but patient is on bowel regimen.   Came in unresponsive, ABG looks good can possibly extubate today.   Left AC PIV infiltrated and to be removed. Patient continues with 1 PIV to right shoulder at this time.  Patient continues with soft limb restraints.   Safety measures remain in place, bed locked in lowest position. Will continue to monitor and SBARR to day shift nurse at change of shift.

## 2021-01-18 NOTE — Procedures (Signed)
Extubation Procedure Note  Patient Details:   Name: James Li DOB: 1991-02-13 MRN: [id number]   Airway Documentation:   Patient extubated per orders. Patient had positive cuff leak prior to extubation. Placed on 4 lpm nasal cannula. Will continue to monitor. Vent end date: 02-01-21 Vent end time: 0901   Evaluation  O2 sats: stable throughout Complications: No apparent complications Patient did tolerate procedure well. Bilateral Breath Sounds: Clear,Diminished   Yes  Suszanne Conners 02-01-2021, 9:01 AM

## 2021-01-18 NOTE — Progress Notes (Signed)
Pharmacy Electrolyte Replacement  Recent Labs:  Recent Labs    02/15/21 0556  K 2.8*  MG 1.5*  PHOS 2.4*  CREATININE 0.91    Low Critical Values (K </= 2.5, Phos </= 1, Mg </= 1) Present: None  Plan:  KCl 40 oral x 1 KCl 40 IV Mg 4 g x 1  Elmer Sow, PharmD, BCPS, BCCCP Clinical Pharmacist [phone]  Please check AMION for all [facility] numbers  02-15-2021 8:39 AM

## 2021-01-18 NOTE — Progress Notes (Signed)
[employer] contacted for restraining order at this time. Patient awake and pulling at tubes and trying to rise out of bed.

## 2021-01-18 NOTE — Plan of Care (Signed)
  Problem: Safety: Goal: Non-violent Restraint(s) Outcome: Progressing   Problem: Activity: Goal: Ability to tolerate increased activity will improve Outcome: Progressing   Problem: Respiratory: Goal: Ability to maintain a clear airway and adequate ventilation will improve Outcome: Progressing   Problem: Role Relationship: Goal: Method of communication will improve Outcome: Progressing   Problem: Education: Goal: Knowledge of General Education information will improve Description: Including pain rating scale, medication(s)/side effects and non-pharmacologic comfort measures Outcome: Progressing   Problem: Health Behavior/Discharge Planning: Goal: Ability to manage health-related needs will improve Outcome: Progressing   Problem: Clinical Measurements: Goal: Ability to maintain clinical measurements within normal limits will improve Outcome: Progressing Goal: Will remain free from infection Outcome: Progressing Goal: Diagnostic test results will improve Outcome: Progressing Goal: Respiratory complications will improve Outcome: Progressing Goal: Cardiovascular complication will be avoided Outcome: Progressing   Problem: Activity: Goal: Risk for activity intolerance will decrease Outcome: Progressing   Problem: Nutrition: Goal: Adequate nutrition will be maintained Outcome: Progressing   Problem: Coping: Goal: Level of anxiety will decrease Outcome: Progressing   Problem: Elimination: Goal: Will not experience complications related to bowel motility Outcome: Progressing Goal: Will not experience complications related to urinary retention Outcome: Progressing   Problem: Pain Managment: Goal: General experience of comfort will improve Outcome: Progressing   Problem: Safety: Goal: Ability to remain free from injury will improve Outcome: Progressing   Problem: Skin Integrity: Goal: Risk for impaired skin integrity will decrease Outcome: Progressing   

## 2021-01-19 ENCOUNTER — Inpatient Hospital Stay (HOSPITAL_COMMUNITY): Payer: Self-pay

## 2021-01-19 ENCOUNTER — Other Ambulatory Visit: Payer: Self-pay

## 2021-01-19 DIAGNOSIS — I469 Cardiac arrest, cause unspecified: Secondary | ICD-10-CM | POA: Diagnosis present

## 2021-01-19 LAB — BASIC METABOLIC PANEL
Anion gap: 11 (ref 5–15)
BUN: 9 mg/dL (ref 6–20)
CO2: 25 mmol/L (ref 22–32)
Calcium: 7.8 mg/dL — ABNORMAL LOW (ref 8.9–10.3)
Chloride: 106 mmol/L (ref 98–111)
Creatinine, Ser: 0.91 mg/dL (ref 0.61–1.24)
GFR, Estimated: >60 mL/min (ref >60)
Glucose, Bld: 112 mg/dL — ABNORMAL HIGH (ref 70–99)
Potassium: 3.4 mmol/L — ABNORMAL LOW (ref 3.5–5.1)
Sodium: 142 mmol/L (ref 135–145)

## 2021-01-19 LAB — URINALYSIS, ROUTINE W REFLEX MICROSCOPIC
Bilirubin Urine: NEGATIVE
Glucose, UA: 150 mg/dL — AB
Ketones, ur: 5 mg/dL — AB
Leukocytes,Ua: NEGATIVE
Nitrite: NEGATIVE
Protein, ur: 100 mg/dL — AB
RBC / HPF: >50 RBC/hpf — ABNORMAL HIGH (ref 0–5)
Specific Gravity, Urine: 1.011 (ref 1.005–1.030)
pH: 7 (ref 5.0–8.0)

## 2021-01-19 LAB — COMPREHENSIVE METABOLIC PANEL
ALT: 91 U/L — ABNORMAL HIGH (ref 0–44)
AST: 73 U/L — ABNORMAL HIGH (ref 15–41)
Albumin: 3.4 g/dL — ABNORMAL LOW (ref 3.5–5.0)
Alkaline Phosphatase: 79 U/L (ref 38–126)
Anion gap: 12 (ref 5–15)
BUN: 8 mg/dL (ref 6–20)
CO2: 21 mmol/L — ABNORMAL LOW (ref 22–32)
Calcium: 7.8 mg/dL — ABNORMAL LOW (ref 8.9–10.3)
Chloride: 108 mmol/L (ref 98–111)
Creatinine, Ser: 1.12 mg/dL (ref 0.61–1.24)
GFR, Estimated: >60 mL/min (ref >60)
Glucose, Bld: 228 mg/dL — ABNORMAL HIGH (ref 70–99)
Potassium: 3.5 mmol/L (ref 3.5–5.1)
Sodium: 141 mmol/L (ref 135–145)
Total Bilirubin: 2.1 mg/dL — ABNORMAL HIGH (ref 0.3–1.2)
Total Protein: 5.6 g/dL — ABNORMAL LOW (ref 6.5–8.1)

## 2021-01-19 LAB — POCT I-STAT 7, (LYTES, BLD GAS, ICA,H+H)
Acid-Base Excess: 1 mmol/L (ref 0.0–2.0)
Bicarbonate: 25 mmol/L (ref 20.0–28.0)
Calcium, Ion: 1.1 mmol/L — ABNORMAL LOW (ref 1.15–1.40)
HCT: 38 % — ABNORMAL LOW (ref 39.0–52.0)
Hemoglobin: 12.9 g/dL — ABNORMAL LOW (ref 13.0–17.0)
O2 Saturation: 100 %
Patient temperature: 36.1
Potassium: 3.3 mmol/L — ABNORMAL LOW (ref 3.5–5.1)
Sodium: 144 mmol/L (ref 135–145)
TCO2: 26 mmol/L (ref 22–32)
pCO2 arterial: 37.1 mmHg (ref 32.0–48.0)
pH, Arterial: 7.433 (ref 7.350–7.450)
pO2, Arterial: 509 mmHg — ABNORMAL HIGH (ref 83.0–108.0)

## 2021-01-19 LAB — POCT I-STAT EG7
Acid-base deficit: 1 mmol/L (ref 0.0–2.0)
Bicarbonate: 24.1 mmol/L (ref 20.0–28.0)
Calcium, Ion: 1.04 mmol/L — ABNORMAL LOW (ref 1.15–1.40)
HCT: 24 % — ABNORMAL LOW (ref 39.0–52.0)
Hemoglobin: 8.2 g/dL — ABNORMAL LOW (ref 13.0–17.0)
O2 Saturation: 90 %
Patient temperature: 36.3
Potassium: 4.3 mmol/L (ref 3.5–5.1)
Sodium: 145 mmol/L (ref 135–145)
TCO2: 25 mmol/L (ref 22–32)
pCO2, Ven: 40.3 mmHg — ABNORMAL LOW (ref 44.0–60.0)
pH, Ven: 7.382 (ref 7.250–7.430)
pO2, Ven: 56 mmHg — ABNORMAL HIGH (ref 32.0–45.0)

## 2021-01-19 LAB — LACTIC ACID, PLASMA
Lactic Acid, Venous: 3.5 mmol/L (ref 0.5–1.9)
Lactic Acid, Venous: 1.8 mmol/L (ref 0.5–1.9)

## 2021-01-19 LAB — ACETAMINOPHEN LEVEL: Acetaminophen (Tylenol), Serum: <10 ug/mL — ABNORMAL LOW (ref 10–30)

## 2021-01-19 LAB — MAGNESIUM
Magnesium: 1.5 mg/dL — ABNORMAL LOW (ref 1.7–2.4)
Magnesium: 1.5 mg/dL — ABNORMAL LOW (ref 1.7–2.4)
Magnesium: 1.5 mg/dL — ABNORMAL LOW (ref 1.7–2.4)

## 2021-01-19 LAB — CBC
HCT: 42.3 % (ref 39.0–52.0)
Hemoglobin: 14.2 g/dL (ref 13.0–17.0)
MCH: 28 pg (ref 26.0–34.0)
MCHC: 33.6 g/dL (ref 30.0–36.0)
MCV: 83.4 fL (ref 80.0–100.0)
Platelets: 190 10*3/uL (ref 150–400)
RBC: 5.07 MIL/uL (ref 4.22–5.81)
RDW: 13.8 % (ref 11.5–15.5)
WBC: 12 10*3/uL — ABNORMAL HIGH (ref 4.0–10.5)
nRBC: 0 % (ref 0.0–0.2)

## 2021-01-19 LAB — TYPE AND SCREEN
ABO/RH(D): O POS
Antibody Screen: NEGATIVE

## 2021-01-19 LAB — PHOSPHORUS
Phosphorus: 2.4 mg/dL — ABNORMAL LOW (ref 2.5–4.6)
Phosphorus: 2.7 mg/dL (ref 2.5–4.6)
Phosphorus: 2.9 mg/dL (ref 2.5–4.6)

## 2021-01-19 LAB — SALICYLATE LEVEL: Salicylate Lvl: <7 mg/dL — ABNORMAL LOW (ref 7.0–30.0)

## 2021-01-19 LAB — RAPID URINE DRUG SCREEN, HOSP PERFORMED
Amphetamines: NOT DETECTED
Barbiturates: NOT DETECTED
Benzodiazepines: NOT DETECTED
Cocaine: NOT DETECTED
Opiates: NOT DETECTED
Tetrahydrocannabinol: NOT DETECTED

## 2021-01-19 LAB — GLUCOSE, CAPILLARY
Glucose-Capillary: 105 mg/dL — ABNORMAL HIGH (ref 70–99)
Glucose-Capillary: 106 mg/dL — ABNORMAL HIGH (ref 70–99)
Glucose-Capillary: 112 mg/dL — ABNORMAL HIGH (ref 70–99)
Glucose-Capillary: 177 mg/dL — ABNORMAL HIGH (ref 70–99)
Glucose-Capillary: 82 mg/dL (ref 70–99)
Glucose-Capillary: 93 mg/dL (ref 70–99)
Glucose-Capillary: 98 mg/dL (ref 70–99)

## 2021-01-19 LAB — PROTIME-INR
INR: 1.1 (ref 0.8–1.2)
Prothrombin Time: 13.6 seconds (ref 11.4–15.2)

## 2021-01-19 LAB — TROPONIN I (HIGH SENSITIVITY)
Troponin I (High Sensitivity): 19 ng/L — ABNORMAL HIGH (ref <18)
Troponin I (High Sensitivity): 67 ng/L — ABNORMAL HIGH (ref <18)

## 2021-01-19 LAB — APTT: aPTT: 26 seconds (ref 24–36)

## 2021-01-19 LAB — BRAIN NATRIURETIC PEPTIDE: B Natriuretic Peptide: 49.8 pg/mL (ref 0.0–100.0)

## 2021-01-19 LAB — ETHANOL: Alcohol, Ethyl (B): <10 mg/dL (ref <10)

## 2021-01-19 MED ORDER — LEVETIRACETAM IN NACL 1500 MG/100ML IV SOLN
1500.0000 mg | Freq: Once | INTRAVENOUS | Status: AC
  Administered 2021-01-19: 1500 mg via INTRAVENOUS
  Filled 2021-01-19: qty 100

## 2021-01-19 MED ORDER — POTASSIUM CHLORIDE 20 MEQ PO PACK
20.0000 meq | PACK | ORAL | Status: AC
  Administered 2021-01-19 (×2): 20 meq
  Filled 2021-01-19 (×2): qty 1

## 2021-01-19 MED ORDER — ORAL CARE MOUTH RINSE
15.0000 mL | OROMUCOSAL | Status: DC
  Administered 2021-01-19: 15 mL via OROMUCOSAL

## 2021-01-19 MED ORDER — MAGNESIUM SULFATE 4 GM/100ML IV SOLN
4.0000 g | Freq: Once | INTRAVENOUS | Status: AC
  Administered 2021-01-19: 4 g via INTRAVENOUS
  Filled 2021-01-19: qty 100

## 2021-01-19 MED ORDER — CHLORHEXIDINE GLUCONATE 0.12% ORAL RINSE (MEDLINE KIT)
15.0000 mL | Freq: Two times a day (BID) | OROMUCOSAL | Status: DC
  Administered 2021-01-19 – 2021-01-26 (×15): 15 mL via OROMUCOSAL

## 2021-01-19 MED ORDER — SODIUM CHLORIDE 0.9 % IV SOLN: INTRAVENOUS | Status: DC

## 2021-01-19 MED ORDER — CHLORHEXIDINE GLUCONATE CLOTH 2 % EX PADS
6.0000 | MEDICATED_PAD | Freq: Every day | CUTANEOUS | Status: DC
  Administered 2021-01-19 – 2021-01-26 (×9): 6 via TOPICAL

## 2021-01-19 MED ORDER — POTASSIUM CHLORIDE 10 MEQ/100ML IV SOLN
10.0000 meq | INTRAVENOUS | Status: AC
  Administered 2021-01-19 (×4): 10 meq via INTRAVENOUS
  Filled 2021-01-19 (×3): qty 100

## 2021-01-19 MED ORDER — PANTOPRAZOLE SODIUM 40 MG IV SOLR
40.0000 mg | Freq: Every day | INTRAVENOUS | Status: DC
  Administered 2021-01-19: 40 mg via INTRAVENOUS
  Filled 2021-01-19: qty 40

## 2021-01-19 MED ORDER — NOREPINEPHRINE 4 MG/250ML-% IV SOLN
0.0000 ug/min | INTRAVENOUS | Status: DC
  Filled 2021-01-19: qty 250

## 2021-01-19 MED ORDER — POTASSIUM CHLORIDE 10 MEQ/50ML IV SOLN
10.0000 meq | INTRAVENOUS | Status: DC
  Filled 2021-01-19: qty 50

## 2021-01-19 MED ORDER — HEPARIN SODIUM (PORCINE) 5000 UNIT/ML IJ SOLN
5000.0000 [IU] | Freq: Three times a day (TID) | INTRAMUSCULAR | Status: DC
  Administered 2021-01-19 – 2021-01-26 (×22): 5000 [IU] via SUBCUTANEOUS
  Filled 2021-01-19 (×23): qty 1

## 2021-01-19 MED ORDER — ASPIRIN 300 MG RE SUPP
300.0000 mg | RECTAL | Status: AC
  Administered 2021-01-19: 300 mg via RECTAL
  Filled 2021-01-19: qty 1

## 2021-01-19 MED ORDER — LEVETIRACETAM IN NACL 1000 MG/100ML IV SOLN
1000.0000 mg | Freq: Two times a day (BID) | INTRAVENOUS | Status: DC
  Administered 2021-01-20 – 2021-01-26 (×13): 1000 mg via INTRAVENOUS
  Filled 2021-01-19 (×13): qty 100

## 2021-01-19 MED ORDER — PANTOPRAZOLE SODIUM 40 MG PO PACK
40.0000 mg | PACK | ORAL | Status: DC
  Administered 2021-01-19 – 2021-01-26 (×8): 40 mg
  Filled 2021-01-19 (×8): qty 20

## 2021-01-19 MED ORDER — ORAL CARE MOUTH RINSE
15.0000 mL | OROMUCOSAL | Status: DC
  Administered 2021-01-19 – 2021-01-26 (×72): 15 mL via OROMUCOSAL

## 2021-01-19 MED ORDER — PROSOURCE TF PO LIQD
45.0000 mL | Freq: Three times a day (TID) | ORAL | Status: DC
  Administered 2021-01-19 – 2021-01-26 (×20): 45 mL
  Filled 2021-01-19 (×20): qty 45

## 2021-01-19 MED ORDER — PROSOURCE TF PO LIQD
45.0000 mL | Freq: Two times a day (BID) | ORAL | Status: DC
  Administered 2021-01-19: 45 mL
  Filled 2021-01-19: qty 45

## 2021-01-19 MED ORDER — PIVOT 1.5 CAL PO LIQD
1000.0000 mL | ORAL | Status: DC
  Administered 2021-01-19: 1000 mL
  Filled 2021-01-19 (×9): qty 1000

## 2021-01-19 MED ORDER — VITAL HIGH PROTEIN PO LIQD
1000.0000 mL | ORAL | Status: AC
  Administered 2021-01-19: 1000 mL

## 2021-01-19 MED ORDER — CHLORHEXIDINE GLUCONATE 0.12% ORAL RINSE (MEDLINE KIT): 15.0000 mL | Freq: Two times a day (BID) | OROMUCOSAL | Status: DC

## 2021-01-19 NOTE — Progress Notes (Signed)
eLink Physician-Brief Progress Note Patient Name: James Li DOB: 07-11-1991 MRN: [id number]   Date of Service  01/19/2021  HPI/Events of Note  Patient brought into [facility] ED by EMS for out of hospital cardiac arrest due to iv Fentanyl overdose, he had been admitted yesterday for near cardiac arrest under the same circumstances, but immediately after extubation he signed himself out against medical advice.  eICU Interventions  New Patient Evaluation completed, Code Cool ordered.        [name] Shylah Dossantos 01/19/2021, 2:59 AM

## 2021-01-19 NOTE — Progress Notes (Signed)
EEG completed, results pending. 

## 2021-01-19 NOTE — Progress Notes (Signed)
Called eLink and notified of critical Trop of 83  Will wait for further instructions.

## 2021-01-19 NOTE — Progress Notes (Signed)
Pharmacy Electrolyte Replacement  Recent Labs:  Recent Labs    01/19/21 0426 01/19/21 0520 01/19/21 1115  K 3.4* 3.3*  --   MG 1.5*  --  1.5*  PHOS 2.4*  --  2.7  CREATININE 0.91  --   --     Low Critical Values (K </= 2.5, Phos </= 1, Mg </= 1) Present: None  MD Contacted:   Plan: Magnesium 4g and KCl IV x4 and per tube x2 doses per [facility] protocol.

## 2021-01-19 NOTE — Progress Notes (Signed)
Having myoclonic seizures.  Will consult neurology.  Concern he has significant anoxic injury after cardiac arrest.  Coralyn Helling, MD [facility] Pulmonary/Critical Care Pager - [phone] 01/19/2021, 2:14 PM

## 2021-01-19 NOTE — Progress Notes (Signed)
Initial Nutrition Assessment  RD working remotely.  DOCUMENTATION CODES:   Not applicable  INTERVENTION:  - will adjust TF order: Pivot 1.5 @ 20 ml/hr to advance by 10 ml every 12 hours to reach goal rate of 50 ml/hr with 45 ml Prosource TF TID. - at goal rate, this regimen + kcal from current propofol rate will provide 2461 kcal, 145 grams protein, and 911 ml free water.  - free water flush, if desired, to be per CCM.  - complete NFPE when feasible.  Monitor magnesium, potassium, and phosphorus daily for at least 3 days, MD to replete as needed, as pt is at risk for refeeding syndrome given unknown nutrition intake PTA and current hypokalemia and hypomagnesemia.   NUTRITION DIAGNOSIS:   Inadequate oral intake related to inability to eat as evidenced by NPO status.  GOAL:   Provide needs based on ASPEN/SCCM guidelines  MONITOR:   Vent status,TF tolerance,Labs,Weight trends  REASON FOR ASSESSMENT:   Ventilator,Consult Enteral/tube feeding initiation and management  ASSESSMENT:   30 year old male with no known medical history. He presented to the ED after OD on fentanyl which led to cardiac arrest. This is his 3rd episode of fentanyl OD in 1 week. He was discharged <12 hours prior to this event. He was found by his girlfriend unconscious and unresponsive next to the [place] he [place] the [place]. CPR x15 minutes, epi x1. He tested positive for COVID-19 on 01/13/21.  Patient was extubated yesterday at ~0900 and Discharge Summary entered at 1137 for patient leaving AMA. He returned to the ED via EMS yesterday ~2220. OGT placed yesterday around 2230 (no imaging reports available to indicate location of tip of tube).   Order currently in place for TF per protocol: Vital High Protein @ 40 ml/hr with 45 ml Prosource TF BID.   The only other [place] RD note was a brief note from this RD on 2/14 for patient who had screened for a new vent but was extubated prior to 1140 AM on that  date.   Weight yesterday was 215 lb and PTA the only other documented weight was on 01/16/21 when he weighed 214 lb. Mild pitting edema to all extremities documented in the edema section of flow sheet.   Per notes: - PEA cardiac arrest from OD - Neurology consulted d/t myoclonic seizures--s/p EEG showing myoclonic seizures and profound diffuse encephalopathy suggestive of anoxic-hypoxic brain injury    Patient is currently intubated on ventilator support MV: 12.3 L/min Temp (24hrs), Avg:96.2 F (35.7 C), Min:94.2 F (34.6 C), Max:97.3 F (36.3 C) Propofol: 20.5 ml/hr (541 kcal)  Labs reviewed; CBGs: 105, 98, 93 mg/dl, K: 3.3 mmol/l, Ca: 7.8 mg/dl, ionized Ca: 1.1 mmol/l, Mg: 1.5 mg/dl.  Medications reviewed; 4 g IV Mg sulfate x1 run 2/19, 40 mg protonix/day, 20 mEq Klor-Con x2 doses 2/19, 10 mEq IV KCl x4 runs 2/19.  IVF; NS @ 50 ml/hr.  Drip; propofol @ 35 mcg/kg/min,    NUTRITION - FOCUSED PHYSICAL EXAM:  unable to complete at this time.   Diet Order:   Diet Order    None      EDUCATION NEEDS:   Not appropriate for education at this time  Skin:  Skin Assessment: Reviewed RN Assessment  Last BM:  PTA/unknown  Height:   Ht Readings from Last 1 Encounters:  01/17/21 6' (1.829 m)    Weight:   Wt Readings from Last 1 Encounters:  01/17/2021 97.8 kg     Estimated  Nutritional Needs:  Kcal:  2250 kcal Protein:  145-160 grams Fluid:  >/= 2.5 L/day      Trenton Gammon, MS, RD, LDN, CNSC Inpatient Clinical Dietitian RD pager # available in [facility]  After hours/weekend pager # available in [facility]

## 2021-01-19 NOTE — Progress Notes (Signed)
NAME:  James Li, MRN:  [id number], DOB:  10-20-1991, LOS: 0 ADMISSION DATE:  02/07/21 CHIEF COMPLAINT:  Acute respiratory distress    Brief History:  30 yo male with repeated episodes of drug overdose leading to VDRF.  This time he required CPR from PEA with ROSC after 15 minutes.  Past Medical History:  Polysubstance abuse  Significant Hospital Events:  2/17 Admitted 2/18 sign out AMA, brought back to ER with PEA cardiac arrest, start normothermia  Consults:    Procedures:  ETT 2/17 >> 2/18 ETT 2/18 >>   Significant Diagnostic Tests:   EEG 2/17 >> generalized slowing  CT head 2/17 >> no acute findings  CT head 2/19 >> no acute findings  EEG 2/19 >>   Micro Data:  COVID PCR 2/13 >> Positive Blood 2/19 >>  Antimicrobials:    Interim History / Subjective:  On vent, cooling pads on.  Objective   Blood pressure (!) 150/99, pulse 64, temperature (!) 97.2 F (36.2 C), temperature source Bladder, resp. rate (!) 21, SpO2 100 %.    Vent Mode: PRVC FiO2 (%):  [30 %-100 %] 30 % Set Rate:  [14 bmp-18 bmp] 14 bmp Vt Set:  [620 mL] 620 mL PEEP:  [5 cmH20] 5 cmH20 Plateau Pressure:  [17 cmH20-22 cmH20] 17 cmH20   Intake/Output Summary (Last 24 hours) at 01/19/2021 0920 Last data filed at 01/19/2021 0800 Gross per 24 hour  Intake 448.88 ml  Output 200 ml  Net 248.88 ml   There were no vitals filed for this visit.   Examination:  General - sedated Eyes - pupils reactive ENT - ETT in place Cardiac - regular rate/rhythm, no murmur Chest - equal breath sounds b/l, no wheezing or rales Abdomen - soft, non tender, + bowel sounds Extremities - no cyanosis, clubbing, or edema Skin - no rashes Neuro - RASS -3  Resolved Hospital Problem list     Assessment & Plan:   Acute metabolic encephalopathy from repeated accidental overdose with hx of substance abuse. - RASS goal 0 to -1 - f/u EEG - normothermia  PEA cardiac arrest from overdose. -  check Echo  Compromised airway in setting of overdose. - full vent support - f/u CXR - goal SpO2 > 92%  COVID 19 positive. - from 01/13/21 - airborne precautions - no therapy at this time  Hypokalemia, hypomagnesemia. - f/u BMET, Mg  Social determinants of health. - consult social work to help with substance abuse programs  Best practice (evaluated daily)  Diet: tube feeds DVT prophylaxis: SQH GI prophylaxis: PPI Mobility: bed rest Disposition: ICU Code Status: Full  Labs    CMP Latest Ref Rng & Units 01/19/2021 01/19/2021 01/19/2021  Glucose 70 - 99 mg/dL - 662(H) -  BUN 6 - 20 mg/dL - 9 -  Creatinine 4.76 - 1.24 mg/dL - 5.46 -  Sodium 503 - 145 mmol/L 144 142 145  Potassium 3.5 - 5.1 mmol/L 3.3(L) 3.4(L) 4.3  Chloride 98 - 111 mmol/L - 106 -  CO2 22 - 32 mmol/L - 25 -  Calcium 8.9 - 10.3 mg/dL - 7.8(L) -  Total Protein 6.5 - 8.1 g/dL - - -  Total Bilirubin 0.3 - 1.2 mg/dL - - -  Alkaline Phos 38 - 126 U/L - - -  AST 15 - 41 U/L - - -  ALT 0 - 44 U/L - - -    CBC Latest Ref Rng & Units 01/19/2021 01/19/2021 01/19/2021  WBC 4.0 -  10.5 K/uL - 12.0(H) -  Hemoglobin 13.0 - 17.0 g/dL 12.9(L) 14.2 8.2(L)  Hematocrit 39.0 - 52.0 % 38.0(L) 42.3 24.0(L)  Platelets 150 - 400 K/uL - 190 -    ABG    Component Value Date/Time   PHART 7.433 01/19/2021 0520   PCO2ART 37.1 01/19/2021 0520   PO2ART 509 (H) 01/19/2021 0520   HCO3 25.0 01/19/2021 0520   TCO2 26 01/19/2021 0520   ACIDBASEDEF 1.0 01/19/2021 0219   O2SAT 100.0 01/19/2021 0520    CBG (last 3)  Recent Labs    01/19/21 0208 01/19/21 0356 01/19/21 0806  GLUCAP 177* 112* 105*    Critical care time: 39 minutes  Coralyn Helling, MD [place] Pulmonary/Critical Care Pager - [phone] - [phone] 01/19/2021, 9:20 AM

## 2021-01-19 NOTE — Progress Notes (Signed)
vLTM EEG started following spot EEG. (same leads)

## 2021-01-19 NOTE — Consult Note (Signed)
Neurology Consultation  Reason for Consult: myoclonus Referring Physician: Halford Chessman, V  CC: twitching  History is obtained from: chart  HPI: James Li is a 30 y.o. male with a PMHx of substance abuse here 3 times in the last week with Fentanyl overdose. On 01/11/2021 he came in after being found unresponsive and was intubated. After extubation, he left AMA. Presents today after being found down. EMS responded and patient was PEA. One round of Epi was given and 15 mins of ACLS before ROSC. Upon arrival, he was intubated. Placed on Propofol and he was noted to have twitching of the face and shoulders. EEG supported this and neurology was called for consult.  ROS: Unable to obtain due to altered mental status.   PMHx-substance abuse.   No family history on file.  Social History:   reports current drug use. Drug: IV. No history on file for tobacco use and alcohol use.  Medications  Current Facility-Administered Medications:  .  0.9 %  sodium chloride infusion, , Intravenous, Continuous, Deland Pretty, MD, Last Rate: 50 mL/hr at 01/19/21 1200, Infusion Verify at 01/19/21 1200 .  chlorhexidine gluconate (MEDLINE KIT) (PERIDEX) 0.12 % solution 15 mL, 15 mL, Mouth Rinse, BID, Deland Pretty, MD, 15 mL at 01/19/21 0800 .  Chlorhexidine Gluconate Cloth 2 % PADS 6 each, 6 each, Topical, Q0600, Deland Pretty, MD, 6 each at 01/19/21 0200 .  feeding supplement (PROSource TF) liquid 45 mL, 45 mL, Per Tube, BID, Sood, Vineet, MD, 45 mL at 01/19/21 1045 .  feeding supplement (VITAL HIGH PROTEIN) liquid 1,000 mL, 1,000 mL, Per Tube, Q24H, Sood, Vineet, MD, 1,000 mL at 01/19/21 1044 .  heparin injection 5,000 Units, 5,000 Units, Subcutaneous, Q8H, Deland Pretty, MD, 5,000 Units at 01/19/21 1351 .  levETIRAcetam (KEPPRA) IVPB 1000 mg/100 mL premix, 1,000 mg, Intravenous, Q12H, Kirby-Graham, Karsten Fells, NP .  levETIRAcetam (KEPPRA) IVPB 1500 mg/ 100 mL premix, 1,500  mg, Intravenous, Once, Kirby-Graham, Karsten Fells, NP .  magnesium sulfate IVPB 4 g 100 mL, 4 g, Intravenous, Once, Sood, Vineet, MD .  MEDLINE mouth rinse, 15 mL, Mouth Rinse, 10 times per day, Deland Pretty, MD, 15 mL at 01/19/21 1600 .  norepinephrine (LEVOPHED) 49m in 2550mpremix infusion, 0-50 mcg/min, Intravenous, Titrated, DoDeland PrettyMD .  pantoprazole sodium (PROTONIX) 40 mg/20 mL oral suspension 40 mg, 40 mg, Per Tube, Q24H, Sood, Vineet, MD, 40 mg at 01/19/21 1044 .  potassium chloride 10 mEq in 100 mL IVPB, 10 mEq, Intravenous, Q1 Hr x 4, Sood, Vineet, MD, Last Rate: 100 mL/hr at 01/19/21 1707, 10 mEq at 01/19/21 1707 .  propofol (DIPRIVAN) 1000 MG/100ML infusion, 5-80 mcg/kg/min, Intravenous, Continuous, GoSherwood GamblerMD, Last Rate: 35.2 mL/hr at 01/19/21 1717, 60 mcg/kg/min at 01/19/21 1717  Exam: Current vital signs: BP 140/79   Pulse 60   Temp (!) 96.4 F (35.8 C) (Bladder)   Resp (!) 21   SpO2 100%  Vital signs in last 24 hours: Temp:  [94.2 F (34.6 C)-97.3 F (36.3 C)] 96.4 F (35.8 C) (02/19 1500) Pulse Rate:  [46-84] 60 (02/19 1526) Resp:  [10-31] 21 (02/19 1526) BP: (133-165)/(74-104) 140/79 (02/19 1526) SpO2:  [93 %-100 %] 100 % (02/19 1526) FiO2 (%):  [30 %-100 %] 30 % (02/19 1526)  GENERAL:inresponsive to name calling, loud clap, or noxious stimuli.  HEENT: - Normocephalic and atraumatic. Intubated.  LUNGS -on ventilator CV - RRR on tele Ext: warming blanket on.  Psych:  calm   GCS: Best motor 1      Best verbal   1      Best eye   1 NEURO:  Mental Status: unresponsive Speech/Language: mute, intubated on ventilator.  Cranial Nerves:  II: eyes are upward III, IV, VI: No nystagmus, + Doll's eyes. He opens his eyes but it does not seem to be a response to name or purposeful. No batting.   V, VII: no corneal reflexes OU.   VIII: no response to name calling, loud clap, or sternal rub.  IX, X: no cough with suction XI: head  midline XII: intubated. .   Motor/Sensory: no purposeful movements or withdrawal to pain.  Tone is normal. Bulk is normal.  Cerebellar: no tremors or clonus.   Gait- bedrest  Labs I have reviewed labs in epic and the results pertinent to this consultation are: CBC    Component Value Date/Time   WBC 12.0 (H) 01/19/2021 0426   RBC 5.07 01/19/2021 0426   HGB 12.9 (L) 01/19/2021 0520   HCT 38.0 (L) 01/19/2021 0520   PLT 190 01/19/2021 0426   MCV 83.4 01/19/2021 0426   MCH 28.0 01/19/2021 0426   MCHC 33.6 01/19/2021 0426   RDW 13.8 01/19/2021 0426   LYMPHSABS 1.1 01/06/2021 2304   MONOABS 0.4 01/01/2021 2304   EOSABS 0.1 01/17/2021 2304   BASOSABS 0.0 01/07/2021 2304   CMP     Component Value Date/Time   NA 144 01/19/2021 0520   K 3.3 (L) 01/19/2021 0520   CL 106 01/19/2021 0426   CO2 25 01/19/2021 0426   GLUCOSE 112 (H) 01/19/2021 0426   BUN 9 01/19/2021 0426   CREATININE 0.91 01/19/2021 0426   CALCIUM 7.8 (L) 01/19/2021 0426   PROT 5.6 (L) 01/14/2021 2304   ALBUMIN 3.4 (L) 01/27/2021 2304   AST 73 (H) 01/05/2021 2304   ALT 91 (H) 01/27/2021 2304   ALKPHOS 79 01/27/2021 2304   BILITOT 2.1 (H) 01/23/2021 2304   GFRNONAA >60 01/19/2021 0426   Imaging MD reviewed the images obtained  CT head 1. No acute intracranial abnormality. 2. Coiling of the transesophageal tube in the posterior oropharynx noted incidentally with mural thickening in the posterior paranasal sinuses and secretions in the nasopharynx likely related to this instrumentation. Consider repositioning.  EEG  -Myoclonic seizures, generalized - Periodic epileptiform discharges, generalized - Background suppression, generalized  This study showed myoclonic seizures as well as profound diffuse encephalopathy. In the setting of cardiac arrest, this is most likely suggestive of anoxic-hypoxic brain injury,  Assessment: 30 yo male with 3 Fentanyl overdoses in past week. This OD resulted in PEA. He is  intubated on the ventilator. Unresponsive on exam. No brain stem reflexes. NO real signs of cortical functioning as well. No twitching noted on exam but Propofol has been increased.   Impression:  1. 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 -Continue LTM EEG for now.   Pt seen by Clance Boll, NP/Neuro and later by MD. Note/plan to be edited by MD as needed.  Pager: [phone]  Attestation:  I saw this patient with the APP on 01/19/21, obtained pertinent aspects of the history, and performed relevant physical and neurological examination as documented. Also, I reviewed the available laboratory data and neuroimages, and other relevant tests/notes/procedures.  Patient comatose. No history available.  My examination findings include no pupillary response. Eye opening briefly to loud noise, and noxious  stimuli. No limb movement. Flaccid. No posturing or other movements to deep pain.  Impression: Comatose after 3rd overdose in ~1 week Burst suppression pattern with low voltage, suppressed background Reportedly myoclonic seizures intermittently on EEG (not personally observed)  Recommendations: Goals of care discussion - very poor prognosis Agree with keppra and LTM EEG as outlined by Ms. Baltazar Najjar.   Thank you.  Perfecto Kingdom, MD

## 2021-01-19 NOTE — Consult Note (Signed)
Responded to page, pt unavailable, staff will call again if further need of chaplains services.  Rev. Georgian Co Chaplain

## 2021-01-19 NOTE — Procedures (Signed)
Patient Name: James Li  MRN: [id number]  Epilepsy Attending: Charlsie Quest  Referring Physician/Provider: Dr Glyn Ade Date:  01/19/2021 Duration: 22.22 mins  Patient history: 29yo M s/p cardiac arrest. EEG to evaluate for seizure  Level of alertness:  Comatose  AEDs during EEG study: Propofol  Technical aspects: This EEG study was done with scalp electrodes positioned according to the 10-20 International system of electrode placement. Electrical activity was acquired at a sampling rate of 500Hz  and reviewed with a high frequency filter of 70Hz  and a low frequency filter of 1Hz . EEG data were recorded continuously and digitally stored.   Description:  EEG showed continuous generalized background suppression. Generalized periodic epileptiform discharges were noted at 1hz . Patient was also noted to have generalized axial twitching with eye opening. Concomitant eeg showed generalized polyspikes consistent with myoclonic seizures. Hyperventilation and photic stimulation were not performed.     ABNORMALITY - Myoclonic seizures, generalized - Periodic epileptiform discharges, generalized - Background suppression, generalized   IMPRESSION: This study showed myoclonic seizures as well as profound diffuse encephalopathy. In the setting of cardiac arrest, this is most likely suggestive of anoxic-hypoxic brain injury,  Dr was notified   James Li 

## 2021-01-19 NOTE — H&P (Signed)
NAME:  James Li, MRN:  [id number], DOB:  [birth year], LOS: 0 ADMISSION DATE:  16-Feb-2021,   Brief History:  30 year old male that presented with in cardiac arrest secondary to fentanyl overdose.  History of Present Illness:  This is a 30 year old white male that presented to the emergency room.  The patient had overdosed on fentanyl leading to cardiac arrest.  This is a third episode of fentanyl overdose this week.  He had been intubated on the prior to events.  Patient was discharged less than 12 hours prior to his cardiac arrest event.  Patient was [place] the [place] the [place] that he was living in.  His girlfriend apparently called EMS when he became unconscious unresponsive.  On arrival of EMS he was found to be in cardiac arrest and CPR was performed for approximately 15 minutes.  1 round of epinephrine was used.  The rhythm was PEA.  On 11/12/2021 the patient was positive for Covid.  He had been asymptomatic at that time.  Past Medical History:  Polysubstance abuse   Consults:  None  Procedures:  Intubated while in the emergency room  Significant Diagnostic Tests:  None  Micro Data:  COVID: + 01/13/2021. Influenza negative  Antimicrobials:  None    Objective   Blood pressure (!) 155/95, pulse (!) 47, temperature (!) 95.9 F (35.5 C), resp. rate (!) 23, SpO2 100 %.    Vent Mode: PRVC FiO2 (%):  [30 %-100 %] 100 % Set Rate:  [18 bmp-20 bmp] 18 bmp Vt Set:  [620 mL] 620 mL PEEP:  [5 cmH20] 5 cmH20 Plateau Pressure:  [14 cmH20-22 cmH20] 22 cmH20  No intake or output data in the 24 hours ending 01/19/21 0114 There were no vitals filed for this visit.  Examination: General: Unconscious/unresponsive, orally intubated with endotracheal tube HENT: Mucous membranes are moist.  No significant JVD Lungs: Clear to auscultation no wheezing rales or rhonchi noted Cardiovascular: Regular rate murmur rub or gallop Abdomen: Soft, nondistended, positive  bowel sounds, no rebound/rigidity/guarding this significantly limited by neurologic status. Extremities: Distal pulse intact x4.  No significant edema or cyanosis. Neuro: Unconscious/unresponsive pupils are 2 mm.  He has no purposeful movement to pain.  Blunted reflexes.    Assessment & Plan:  Status post cardiac arrest Third fentanyl overdose this week Acute respiratory failure COVID positive Metabolic encephalopathy  Plan: Patient be admitted into the intensive care unit for further work-up. Standard ventilator protocol was initiated. Patient has had no significant neurologic recovery since cardiac arrest event.  Therefore hypothermic protocol has been initiated. Patient is currently on low-dose propofol for ventilator respiratory interaction. Continue to monitor neurologic status. Monitor I's/O's N.p.o.  Best practice (evaluated daily)  Diet: N.p.o. Pain/Anxiety/Delirium protocol (if indicated): Propofol VAP protocol (if indicated): Initiated DVT prophylaxis: Lovenox GI prophylaxis: Protonix Glucose control: Monitor blood sugar Mobility: Bedrest Disposition: ICU  Goals of Care:   Code Status: Full  Labs   CBC: Recent Labs  Lab 01/13/21 1534 01/13/21 1539 01/15/21 0856 01/16/21 0208 01/17/21 1607 01/17/21 2349 2021-02-16 0415 2021/02/16 0556 2021-02-16 2304 02-16-2021 2316 February 16, 2021 2325  WBC 11.4*   < > 5.9 4.7  --  6.3  --  5.7 12.1*  --   --   NEUTROABS 10.0*  --   --  2.8  --  3.7  --   --  10.4*  --   --   HGB 13.5   < > 12.5* 12.1*   < > 11.6* 10.5*  11.4* 13.5 13.6 13.3  HCT 41.7   < > 36.1* 37.2*   < > 32.6* 31.0* 31.8* 41.6 40.0 39.0  MCV 84.6   < > 84.3 85.9  --  80.9  --  81.1 86.0  --   --   PLT 231   < > 170 136*  --  160  --  168 146*  --   --    < > = values in this interval not displayed.    Basic Metabolic Panel: Recent Labs  Lab 01/13/21 1702 01/14/21 0521 01/15/21 0856 01/16/21 0208 01/17/21 1600 01/17/21 1607 2021-01-29 0415  01-29-21 0556 01-29-21 2304 2021/01/29 2316 01/29/21 2325  NA  --  142 140 143 142   < > 144 141 141 143 145  K  --  3.3* 3.5 3.7 3.1*   < > 2.9* 2.8* 3.5 3.4* 3.5  CL  --  113* 110 111 107  --   --  107 108  --  105  CO2  --  21* 22 24 23   --   --  24 21*  --   --   GLUCOSE  --  101* 91 83 157*  --   --  94 228*  --  222*  BUN  --  14 6 <5* 7  --   --  7 8  --  8  CREATININE  --  0.96 0.80 0.78 1.09  --   --  0.91 1.12  --  1.00  CALCIUM  --  8.0* 8.3* 8.4* 8.0*  --   --  8.1* 7.8*  --   --   MG 2.3 1.9 1.9 1.7  --   --   --  1.5*  --   --   --   PHOS 1.2* 2.8  --   --   --   --   --  2.4*  --   --   --    < > = values in this interval not displayed.   GFR: Estimated Creatinine Clearance: 134.5 mL/min (by C-G formula based on SCr of 1 mg/dL). Recent Labs  Lab 01/13/21 1410 01/13/21 1534 01/13/21 1702 01/14/21 0521 01/16/21 0208 01/17/21 2349 2021/01/29 0556 01-29-21 2304  PROCALCITON  --   --  <0.10  --   --   --   --   --   WBC  --    < >  --    < > 4.7 6.3 5.7 12.1*  LATICACIDVEN >11.0*  --  1.6  --   --   --   --  3.5*   < > = values in this interval not displayed.    Liver Function Tests: Recent Labs  Lab 01/15/21 0856 01/16/21 0208 01/17/21 1600 01-29-2021 0556 01/29/21 2304  AST 34 57* 83* 55* 73*  ALT 24 47* 88* 79* 91*  ALKPHOS 50 48 58 51 79  BILITOT 3.4* 2.8* 1.8* 1.9* 2.1*  PROT 5.4* 5.6* 5.4* 5.1* 5.6*  ALBUMIN 3.4* 3.4* 3.4* 3.2* 3.4*   No results for input(s): LIPASE, AMYLASE in the last 168 hours. No results for input(s): AMMONIA in the last 168 hours.  ABG    Component Value Date/Time   PHART 7.315 (L) 2021/01/29 2316   PCO2ART 45.6 01/29/2021 2316   PO2ART 61 (L) 2021-01-29 2316   HCO3 23.9 01-29-2021 2316   TCO2 23 Jan 29, 2021 2325   ACIDBASEDEF 3.0 (H) January 29, 2021 2316   O2SAT 92.0 01-29-21 2316  Coagulation Profile: Recent Labs  Lab 01/17/21 1600 Jan 30, 2021 2304  INR 1.2 1.1    Cardiac Enzymes: No results for input(s): CKTOTAL,  CKMB, CKMBINDEX, TROPONINI in the last 168 hours.  HbA1C: Hgb A1c MFr Bld  Date/Time Value Ref Range Status  01/13/2021 05:02 PM 4.5 (L) 4.8 - 5.6 % Final    Comment:    (NOTE) Pre diabetes:          5.7%-6.4%  Diabetes:              >6.4%  Glycemic control for   <7.0% adults with diabetes     CBG: Recent Labs  Lab 01/16/21 0754 01/17/21 2025 01/17/21 2352 2021/01/30 0356 01/30/2021 0712  GLUCAP 77 77 80 90 89    Review of Systems:   Unable to obtain secondary neurologic status  Past Medical History:  He,  has no past medical history on file.   Surgical History:  No past surgical history on file.   Social History:   reports current drug use. Drug: IV.   Family History:  His family history is not on file.   Allergies Allergies  Allergen Reactions  . Ibuprofen Rash  . Tylenol [Acetaminophen] Rash     Home Medications  Prior to Admission medications   Medication Sig Start Date End Date Taking? Authorizing Provider  Multiple Vitamin (MULTIVITAMIN) tablet Take 1 tablet by mouth daily.    [provider]     Critical care time: 50 minutes

## 2021-01-20 ENCOUNTER — Inpatient Hospital Stay (HOSPITAL_COMMUNITY): Payer: Self-pay

## 2021-01-20 DIAGNOSIS — U071 COVID-19: Secondary | ICD-10-CM

## 2021-01-20 DIAGNOSIS — G931 Anoxic brain damage, not elsewhere classified: Secondary | ICD-10-CM

## 2021-01-20 DIAGNOSIS — G40409 Other generalized epilepsy and epileptic syndromes, not intractable, without status epilepticus: Secondary | ICD-10-CM

## 2021-01-20 DIAGNOSIS — J988 Other specified respiratory disorders: Secondary | ICD-10-CM

## 2021-01-20 DIAGNOSIS — I469 Cardiac arrest, cause unspecified: Secondary | ICD-10-CM

## 2021-01-20 LAB — ECHOCARDIOGRAM COMPLETE
AR max vel: 4.27 cm2
AV Area VTI: 4 cm2
AV Area mean vel: 3.76 cm2
AV Mean grad: 5 mmHg
AV Peak grad: 8.1 mmHg
Ao pk vel: 1.42 m/s
Area-P 1/2: 5.02 cm2
S' Lateral: 3.4 cm
Weight: 3449.76 oz

## 2021-01-20 LAB — MAGNESIUM: Magnesium: 2 mg/dL (ref 1.7–2.4)

## 2021-01-20 LAB — BASIC METABOLIC PANEL
Anion gap: 9 (ref 5–15)
BUN: 7 mg/dL (ref 6–20)
CO2: 25 mmol/L (ref 22–32)
Calcium: 8.1 mg/dL — ABNORMAL LOW (ref 8.9–10.3)
Chloride: 110 mmol/L (ref 98–111)
Creatinine, Ser: 0.68 mg/dL (ref 0.61–1.24)
GFR, Estimated: >60 mL/min (ref >60)
Glucose, Bld: 116 mg/dL — ABNORMAL HIGH (ref 70–99)
Potassium: 3.3 mmol/L — ABNORMAL LOW (ref 3.5–5.1)
Sodium: 144 mmol/L (ref 135–145)

## 2021-01-20 LAB — CBC
HCT: 34.6 % — ABNORMAL LOW (ref 39.0–52.0)
Hemoglobin: 12.1 g/dL — ABNORMAL LOW (ref 13.0–17.0)
MCH: 28.5 pg (ref 26.0–34.0)
MCHC: 35 g/dL (ref 30.0–36.0)
MCV: 81.6 fL (ref 80.0–100.0)
Platelets: 164 10*3/uL (ref 150–400)
RBC: 4.24 MIL/uL (ref 4.22–5.81)
RDW: 14.2 % (ref 11.5–15.5)
WBC: 7.7 10*3/uL (ref 4.0–10.5)
nRBC: 0 % (ref 0.0–0.2)

## 2021-01-20 LAB — GLUCOSE, CAPILLARY
Glucose-Capillary: 92 mg/dL (ref 70–99)
Glucose-Capillary: 120 mg/dL — ABNORMAL HIGH (ref 70–99)
Glucose-Capillary: 95 mg/dL (ref 70–99)
Glucose-Capillary: 80 mg/dL (ref 70–99)
Glucose-Capillary: 71 mg/dL (ref 70–99)
Glucose-Capillary: 94 mg/dL (ref 70–99)

## 2021-01-20 LAB — PHOSPHORUS: Phosphorus: 2 mg/dL — ABNORMAL LOW (ref 2.5–4.6)

## 2021-01-20 MED ORDER — POTASSIUM CHLORIDE 10 MEQ/50ML IV SOLN: 10.0000 meq | INTRAVENOUS | Status: DC

## 2021-01-20 MED ORDER — ARTIFICIAL TEARS OPHTHALMIC OINT
TOPICAL_OINTMENT | Freq: Three times a day (TID) | OPHTHALMIC | Status: DC
  Administered 2021-01-21: 1 via OPHTHALMIC
  Filled 2021-01-20 (×2): qty 3.5

## 2021-01-20 MED ORDER — POLYVINYL ALCOHOL 1.4 % OP SOLN
1.0000 [drp] | OPHTHALMIC | Status: DC | PRN
  Administered 2021-01-20: 1 [drp] via OPHTHALMIC
  Filled 2021-01-20: qty 15

## 2021-01-20 MED ORDER — POTASSIUM PHOSPHATES 15 MMOLE/5ML IV SOLN
15.0000 mmol | Freq: Once | INTRAVENOUS | Status: AC
  Administered 2021-01-20: 15 mmol via INTRAVENOUS
  Filled 2021-01-20: qty 5

## 2021-01-20 MED ORDER — POTASSIUM CHLORIDE 20 MEQ PO PACK
20.0000 meq | PACK | Freq: Once | ORAL | Status: AC
  Administered 2021-01-20: 20 meq
  Filled 2021-01-20: qty 1

## 2021-01-20 MED ORDER — POTASSIUM CHLORIDE 10 MEQ/100ML IV SOLN
10.0000 meq | INTRAVENOUS | Status: AC
Start: 2021-01-20 — End: 2021-01-20
  Administered 2021-01-20 (×2): 10 meq via INTRAVENOUS
  Filled 2021-01-20 (×2): qty 100

## 2021-01-20 NOTE — Progress Notes (Signed)
EEG maintenance complete. No skin breakdown at FP1 FP2 F7. Continue to monitor

## 2021-01-20 NOTE — Progress Notes (Signed)
  Echocardiogram 2D Echocardiogram has been performed.  James Li 01/20/2021, 10:06 AM

## 2021-01-20 NOTE — Progress Notes (Addendum)
Neurology Progress Note  S: Patient is unresponsive. Per RN, he has had his eyes open spontaneously but still without cough, purposeful movement, or withdrawal to pain. Propofol on 40.    O: Current vital signs: BP 136/76   Pulse 83   Temp 98.2 F (36.8 C)   Resp 18   SpO2 100%  Vital signs in last 24 hours: Temp:  [96.1 F (35.6 C)-98.4 F (36.9 C)] 98.2 F (36.8 C) (02/20 0800) Pulse Rate:  [52-87] 83 (02/20 0900) Resp:  [15-29] 18 (02/20 0900) BP: (115-154)/(62-86) 136/76 (02/20 0900) SpO2:  [95 %-100 %] 100 % (02/20 0900) FiO2 (%):  [30 %] 30 % (02/20 0800)  GENERAL: Eyes open. Acutely ill young male lying in bed intubated.  HEENT: Normocephalic and atraumatic. ETT LUNGS: on ventilator CV: RRR on tele.  ABDOMEN: Soft, nontender Ext: warm  GCS       Best eye  4         Best verbal  1       Best motor   1     Total  5   NEURO:  Mental Status: comatose.  Speech/Language: mute, intubated on ventilator.  Cranial Nerves:  II: pupil OD sluggish, OS unreactive.  III, IV, VI: No nystagmus. Doll's eyes present on left. Overcomes gaze on right. Eyelids elevate symmetrically.  V, VII: No corneal reflexes OU.  VIII: No response to name calling, loud clap, or noxious stimuli.  IX, X: no cough with suction.  XI: head midline XII: head midline.  Motor/sensation: no purposeful movements or withdrawal to pain nor decerebrate response. Tone: extremities are flaccid. Bulk is normal Coordination: no response to touch or noxious stimuli.  Cerebellar: no tremors or clonus.   Medications  Current Facility-Administered Medications:  .  0.9 %  sodium chloride infusion, , Intravenous, Continuous, Deland Pretty, MD, Last Rate: 50 mL/hr at 01/20/21 0700, Infusion Verify at 01/20/21 0700 .  chlorhexidine gluconate (MEDLINE KIT) (PERIDEX) 0.12 % solution 15 mL, 15 mL, Mouth Rinse, BID, Deland Pretty, MD, 15 mL at 01/20/21 0829 .  Chlorhexidine Gluconate Cloth 2 % PADS 6  each, 6 each, Topical, Q0600, Deland Pretty, MD, 6 each at 01/20/21 0500 .  feeding supplement (PIVOT 1.5 CAL) liquid 1,000 mL, 1,000 mL, Per Tube, Continuous, Sood, Vineet, MD, Last Rate: 40 mL/hr at 01/19/21 2129, 1,000 mL at 01/19/21 2129 .  feeding supplement (PROSource TF) liquid 45 mL, 45 mL, Per Tube, TID, Chesley Mires, MD, 45 mL at 01/19/21 2114 .  heparin injection 5,000 Units, 5,000 Units, Subcutaneous, Q8H, Deland Pretty, MD, 5,000 Units at 01/20/21 [phone] .  levETIRAcetam (KEPPRA) IVPB 1000 mg/100 mL premix, 1,000 mg, Intravenous, Q12H, Kirby-Graham, Karsten Fells, NP, Stopped at 01/20/21 0542 .  MEDLINE mouth rinse, 15 mL, Mouth Rinse, 10 times per day, Deland Pretty, MD, 15 mL at 01/20/21 0527 .  norepinephrine (LEVOPHED) 73m in 2526mpremix infusion, 0-50 mcg/min, Intravenous, Titrated, DoDeland PrettyMD .  pantoprazole sodium (PROTONIX) 40 mg/20 mL oral suspension 40 mg, 40 mg, Per Tube, Q24H, Sood, Vineet, MD, 40 mg at 01/19/21 1044 .  potassium chloride 10 mEq in 100 mL IVPB, 10 mEq, Intravenous, Q1 Hr x 2, MiPriscella MannRPH, Last Rate: 100 mL/hr at 01/20/21 0844, 10 mEq at 01/20/21 0844 .  potassium PHOSPHATE 15 mmol in dextrose 5 % 250 mL infusion, 15 mmol, Intravenous, Once, Millen, Jessica B, RPH .  propofol (DIPRIVAN) 1000 MG/100ML infusion, 5-80 mcg/kg/min,  Intravenous, Continuous, Sherwood Gambler, MD, Last Rate: 17.6 mL/hr at 01/20/21 0845, 30 mcg/kg/min at 01/20/21 0845  Imaging MD has reviewed images in epic and the results pertinent to this consultation are:  CT Head 1. No acute intracranial abnormality. 2. Coiling of the transesophageal tube in the posterior oropharynx noted incidentally with mural thickening in the posterior paranasal sinuses and secretions in the nasopharynx likely related to this instrumentation. Consider repositioning.  EEG overnight This studyinitially showed myoclonic seizures as well as profound diffuse  encephalopathy. After around 1900 on 01/19/2021, no further seizures were noted and eeg showed generalized epileptogenicity as well as severe diffuse encephalopathy. EEG appears to be improving compared to yesterday.   Assessment and Plan:   1. 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 Myoclonic seizures were seen but around 1900 on 01/19/21, no further seizures were noted. Keppra load was given yesterday. Continue 1 gm IV every 12 hours.  Continue long term EEG for now. May be able to d/c tomorrow.  2. Fentanyl overdose with cardiac arrest. Still no corneal reflexes, cough, response to stimuli, or purposeful movements. Doll's eyes not present on the right, which is different than yesterday's exam.   Pt seen by Clance Boll, MSN, APN-BC/Nurse Practitioner/Neuro and later by MD. Note and plan to be edited as needed by MD.  Pager: [phone]

## 2021-01-20 NOTE — Progress Notes (Signed)
James Li from [employer] called.... update given   Please call James/[facility] w/any updates going forward.

## 2021-01-20 NOTE — Progress Notes (Signed)
NAME:  James Li, MRN:  [id number], DOB:  06/14/1991, LOS: 1 ADMISSION DATE:  2021/01/25 CHIEF COMPLAINT:  Acute respiratory distress    Brief History:  30 yo male with repeated episodes of drug overdose leading to VDRF.  This time he required CPR from PEA with ROSC after 15 minutes.  Past Medical History:  Polysubstance abuse  Significant Hospital Events:  2/17 Admitted 2/18 sign out AMA, brought back to ER with PEA cardiac arrest, start normothermia 2/19 developed myoclonic seizures  Consults:  Neurology  Procedures:  ETT 2/17 >> 2/18 ETT 2/18 >>   Significant Diagnostic Tests:   EEG 2/17 >> generalized slowing  CT head 2/17 >> no acute findings  CT head 2/19 >> no acute findings  EEG 2/19 >> generalized myoclonic seizures, periodic epileptiform discharges, background suppression and slowing  Echo 2/20 >>   Micro Data:  COVID PCR 2/13 >> Positive Blood 2/19 >>  Antimicrobials:    Interim History / Subjective:  Remains on vent, diprivan, EEG.  Objective   Blood pressure 136/76, pulse 83, temperature 98.2 F (36.8 C), resp. rate 18, SpO2 100 %.    Vent Mode: PRVC FiO2 (%):  [30 %] 30 % Set Rate:  [14 bmp] 14 bmp Vt Set:  [620 mL] 620 mL PEEP:  [5 cmH20] 5 cmH20 Pressure Support:  [10 cmH20] 10 cmH20 Plateau Pressure:  [17 cmH20-19 cmH20] 17 cmH20   Intake/Output Summary (Last 24 hours) at 01/20/2021 1028 Last data filed at 01/20/2021 1000 Gross per 24 hour  Intake 3359.4 ml  Output 1255 ml  Net 2104.4 ml   There were no vitals filed for this visit.   Examination:  General - on vent Eyes - pupils reactive, blank stare, blinks with stimulation ENT - ETT in place Cardiac - regular rate/rhythm, no murmur Chest - equal breath sounds b/l, no wheezing or rales Abdomen - soft, non tender, + bowel sounds Extremities - no cyanosis, clubbing, or edema Skin - no rashes Neuro - not following commands   Resolved Hospital Problem list      Assessment & Plan:   Acute metabolic/anoxic encephalopathy from cardiac arrest in setting of repeated accidental overdose with hx of substance abuse. 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 - normothermia >> protocol completion on 2/21 - neurology consulted - continue EEG monitor - likely will need MRI brain at some point - continue keppra - wean diprivan as able  PEA cardiac arrest from overdose. - f/u Echo  Compromised airway in setting of overdose. - full vent support - f/u CXR intermittently  COVID 19 positive. - from 01/13/21 - airborne precautions - no therapy at this time  Hypokalemia, hypophosphatemia, hypomagnesemia. - f/u BMET, Mg, Ph  Best practice (evaluated daily)  Diet: tube feeds DVT prophylaxis: SQH GI prophylaxis: protonix Mobility: bed rest Disposition: ICU Code Status: Full  Labs    CMP Latest Ref Rng & Units 01/20/2021 01/19/2021 01/19/2021  Glucose 70 - 99 mg/dL 175(Z) - 025(E)  BUN 6 - 20 mg/dL 7 - 9  Creatinine 5.27 - 1.24 mg/dL 7.82 - 4.23  Sodium 536 - 145 mmol/L 144 144 142  Potassium 3.5 - 5.1 mmol/L 3.3(L) 3.3(L) 3.4(L)  Chloride 98 - 111 mmol/L 110 - 106  CO2 22 - 32 mmol/L 25 - 25  Calcium 8.9 - 10.3 mg/dL 8.1(L) - 7.8(L)  Total Protein 6.5 - 8.1 g/dL - - -  Total Bilirubin 0.3 - 1.2 mg/dL - - -  Alkaline Phos 38 - 126  U/L - - -  AST 15 - 41 U/L - - -  ALT 0 - 44 U/L - - -    CBC Latest Ref Rng & Units 01/20/2021 01/19/2021 01/19/2021  WBC 4.0 - 10.5 K/uL 7.7 - 12.0(H)  Hemoglobin 13.0 - 17.0 g/dL 12.1(L) 12.9(L) 14.2  Hematocrit 39.0 - 52.0 % 34.6(L) 38.0(L) 42.3  Platelets 150 - 400 K/uL 164 - 190    ABG    Component Value Date/Time   PHART 7.433 01/19/2021 0520   PCO2ART 37.1 01/19/2021 0520   PO2ART 509 (H) 01/19/2021 0520   HCO3 25.0 01/19/2021 0520   TCO2 26 01/19/2021 0520   ACIDBASEDEF 1.0 01/19/2021 0219   O2SAT 100.0 01/19/2021 0520    CBG (last 3)  Recent Labs    01/19/21 2337 01/20/21 0338  01/20/21 0804  GLUCAP 82 92 120*    Critical care time: 32 minutes  Coralyn Helling, MD [place] [facility]monary/Critical Care Pager - [phone] 01/20/2021, 10:28 AM

## 2021-01-20 NOTE — Progress Notes (Signed)
Pharmacy Electrolyte Replacement  Recent Labs:  Recent Labs    01/20/21 0610  K 3.3*  MG 2.0  PHOS 2.0*  CREATININE 0.68    Low Critical Values (K </= 2.5, Phos </= 1, Mg </= 1) Present: None  MD Contacted: n/a  Plan: KPhos 15 mmol IV x1 and KCl IV x2 + KCl per tube x1 per Elink protocol.

## 2021-01-20 NOTE — Progress Notes (Signed)
[address] Notified of GCS 4, Referral # [id number]

## 2021-01-20 NOTE — Progress Notes (Signed)
Unable to obtain weight. Bed not working properly.

## 2021-01-20 NOTE — Procedures (Addendum)
Patient Name: James Li  MRN: [id number]  Epilepsy Attending: Charlsie Quest  Referring Physician/Provider: Dr Glyn Ade Duration: 01/19/2021 1312 to 01/20/2021 1312  Patient history: 29yo M s/p cardiac arrest. EEG to evaluate for seizure  Level of alertness:  Comatose  AEDs during EEG study: Propofol, LEV  Technical aspects: This EEG study was done with scalp electrodes positioned according to the 10-20 International system of electrode placement. Electrical activity was acquired at a sampling rate of 500Hz  and reviewed with a high frequency filter of 70Hz  and a low frequency filter of 1Hz . EEG data were recorded continuously and digitally stored.   Description:  EEG initially showed continuous generalized background suppression. Generalized periodic epileptiform discharges were noted at 0.5-1hz . Patient was also noted to initially have generalized axial twitching with eye opening and eventually only opening.Concomitant eeg showed generalized polyspikes consistent with myoclonic seizures. After around 1900, eeg showed continuous generalized low amplitude 3-5hz  theta-delta slowing admixed with generalized spikes at times periodic at 1-Hz.      ABNORMALITY - Myoclonic seizures, generalized - Periodic epileptiform discharges, generalized - Background suppression, generalized  - Continuous slow. Generalized   IMPRESSION: This study initially showed myoclonic seizures as well as profound diffuse encephalopathy. After around 1900 on 01/19/2021, no further seizures were noted and eeg showed generalized epileptogenicity as well as severe diffuse encephalopathy.  EEG appears to be improving compared to yesterday.  Demarea Lorey 

## 2021-01-20 NOTE — Progress Notes (Addendum)
Tried calling pt's father at listed number.  No answer.  Coralyn Helling, MD [facility] Pulmonary/Critical Care Pager - [phone] 01/20/2021, 12:59 PM  Pt's father called back.  I updated him about current status and treatment plan.  Explained that main concern is for anoxic encephalopathy.  Explained that if no significant improvement in next 24 to 48 hours, then neuro prognosis will likely be grim.  Coralyn Helling, MD [facility] Pulmonary/Critical Care Pager - [phone] 01/20/2021, 2:11 PM

## 2021-01-21 DIAGNOSIS — J9600 Acute respiratory failure, unspecified whether with hypoxia or hypercapnia: Secondary | ICD-10-CM

## 2021-01-21 DIAGNOSIS — G928 Other toxic encephalopathy: Secondary | ICD-10-CM

## 2021-01-21 LAB — GLUCOSE, CAPILLARY
Glucose-Capillary: 108 mg/dL — ABNORMAL HIGH (ref 70–99)
Glucose-Capillary: 114 mg/dL — ABNORMAL HIGH (ref 70–99)
Glucose-Capillary: 148 mg/dL — ABNORMAL HIGH (ref 70–99)
Glucose-Capillary: 81 mg/dL (ref 70–99)
Glucose-Capillary: 82 mg/dL (ref 70–99)
Glucose-Capillary: 96 mg/dL (ref 70–99)

## 2021-01-21 LAB — BASIC METABOLIC PANEL
Anion gap: 11 (ref 5–15)
BUN: 13 mg/dL (ref 6–20)
CO2: 22 mmol/L (ref 22–32)
Calcium: 8.2 mg/dL — ABNORMAL LOW (ref 8.9–10.3)
Chloride: 108 mmol/L (ref 98–111)
Creatinine, Ser: 0.56 mg/dL — ABNORMAL LOW (ref 0.61–1.24)
GFR, Estimated: >60 mL/min (ref >60)
Glucose, Bld: 104 mg/dL — ABNORMAL HIGH (ref 70–99)
Potassium: 4.3 mmol/L (ref 3.5–5.1)
Sodium: 141 mmol/L (ref 135–145)

## 2021-01-21 LAB — MAGNESIUM: Magnesium: 1.8 mg/dL (ref 1.7–2.4)

## 2021-01-21 LAB — PHOSPHORUS: Phosphorus: 3.1 mg/dL (ref 2.5–4.6)

## 2021-01-21 NOTE — Procedures (Addendum)
Patient Name:Savva Kalee Mcclenathan BCW:[id number] Epilepsy Attending:Dearis Danis Annabelle Harman Referring Physician/Provider:Dr Glyn Ade Duration:01/20/2021 0388 to 01/21/2021 1312  Patient history:29yo M s/p cardiac arrest. EEG to evaluate for seizure  Level of alertness:Comatose  AEDs during EEG study:Propofol, LEV  Technical aspects: This EEG study was done with scalp electrodes positioned according to the 10-20 International system of electrode placement. Electrical activity was acquired at a sampling rate of 500Hz  and reviewed with a high frequency filter of 70Hz  and a low frequency filter of 1Hz . EEG data were recorded continuously and digitally stored.   Description:EEG showed continuous generalized low amplitude 3-5hz  theta-delta slowing admixed with generalized and maximal bifrontal spikes.   ABNORMALITY - Spike, generalized and maximal bifrontal - Continuous slow, Generalized   IMPRESSION: This study showed evidence of generalized epileptogenicity as well as severe diffuse encephalopathy. No definite seizures were seen during the study.  Robecca Fulgham 

## 2021-01-21 NOTE — Progress Notes (Signed)
Updated pt's father via phone. He, his wife and another brother are en route via car from [place] to [place] to see him.   Father also reported pt has a girlfriend of 10 years who would like to see him as well.   Did advise him we do have restrictions on visitation at this time 2/2 current pandemic.   I also updated him on the need for at least 72 hours for hopefully better prognostication.

## 2021-01-21 NOTE — Progress Notes (Signed)
eLink Physician-Brief Progress Note Patient Name: James Li DOB: [birth year] MRN: [id number]   Date of Service  01/21/2021  HPI/Events of Note  Fever to 101.8 F - Nursing request for Tylenol. Patient has allergies to Motrin and Tylenol.   eICU Interventions  Plan: 1. Cooling blanket PRN.      Intervention Category Major Interventions: Other:  James Li 01/21/2021, 8:56 PM

## 2021-01-21 NOTE — Progress Notes (Addendum)
NAME:  James Li, MRN:  [id number], DOB:  March 21, 1991, LOS: 2 ADMISSION DATE:  01/03/2021,  REFERRING MD:  EDP, CHIEF COMPLAINT:  Acute Respiratory Distress   Brief History:  30 yo male with repeated episodes of drug overdose leading to VDRF.  This time he required CPR from PEA with ROSC after 15 minutes. Third overdose with fentanyl in a relatively short period of time.  History of Present Illness:  30 yo with h/o homelessness, drug abuse presented repeatedly for opioid overdose.   Past Medical History:  Polysubstance abuse  Significant Hospital Events:  2/17 Admitted 2/18 sign out AMA, brought back to ER with PEA cardiac arrest, start normothermia 2/19 developed myoclonic seizures 2/22 Normothermia  Consults:  Neurology  Procedures:  ETT 2/17 >> 2/18 ETT 2/18 >>   Significant Diagnostic Tests:   EEG 2/17 >> generalized slowing  CT head 2/17 >> no acute findings  CT head 2/19 >> no acute findings  EEG 2/19 >> generalized myoclonic seizures, periodic epileptiform discharges, background suppression and slowing  Echo 2/20 >> EF 60-65% normal LV, RV function, elevated R A pressure at 15   Micro Data:  COVID PCR 2/13 >> Positive Blood 2/19 >> No growth for 2 days  Antimicrobials:    Interim History / Subjective:  2/21: pt remains on LTM without seizure but generalized epileptogenicity noted, neuro following. Extensor posturing with painful stim. Eyes fixed up. Minimal vent settings. Rewarmed this am.  Objective   Blood pressure (!) 144/84, pulse 68, temperature 98.6 F (37 C), temperature source Core, resp. rate 15, weight 100 kg, SpO2 100 %.    Vent Mode: PRVC FiO2 (%):  [30 %] 30 % Set Rate:  [14 bmp] 14 bmp Vt Set:  [620 mL] 620 mL PEEP:  [5 cmH20] 5 cmH20 Plateau Pressure:  [17 cmH20-20 cmH20] 20 cmH20   Intake/Output Summary (Last 24 hours) at 01/21/2021 1046 Last data filed at 01/21/2021 1000 Gross per 24 hour  Intake 2458.19 ml  Output  935 ml  Net 1523.19 ml   Filed Weights   01/21/21 0500  Weight: 100 kg    Examination: General: On vent, on diprivan gtt, and on artificial cooling pads HENT: Pupils unable to examine 2/2 upward gaze Lungs: CTA bilaterally; no wheezes, rales Cardiovascular: RRR,no appreciable murmur; cool extremities but cap refill <3 seconds Abdomen: covered in cooling apparatus; positive bowel sounds; non-tender with palpation Extremities: no cyanosis; no edema BUE/BLE Neuro: Not following commands; extensor posturing to noxious stimuli; did flex feet bilaterally with Babinski test, without upward movement of great toe bilaterally. GU: Foley Psych: Unable to assess  Resolved Hospital Problem list     Assessment & Plan:  Acute metabolic/anoxic encephalopathy from cardiac arrest in setting of repeated accidental overdose with hx of substance abuse. 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 - normothermia >> protocol completion on 2/21  -await 28 hrs after normothermia to get a clearer picture of prognosis - continuing EEG monitor per neuro;  that EEG is improved from prior. Today, this study showed evidence of generalized epileptogenicity as well as severe diffuse encephalopathy. No definite seizures were seen during the study. - likely will need MRI brain at some point - AED's per neuro - wean diprivan as able   PEA cardiac arrest from overdose. - Echo unremarkable. EF of 60-65%, no ventricle dysfunction, but elevated R A pressure at 15. Inferior vena cava dilated with <50% respiratory variability -hemodynamically stable  Compromised airway in setting of overdose. Chest xray on  2/20 improving bilateral upper lobe opacities. - full vent support -diprivan gtt -can sbt based on vent settings but mental status precluding extubation  COVID 19 positive. - from 01/13/21 - airborne precautions - no therapy at this time  Hypokalemia, hypophosphatemia, hypomagnesemia. - f/u BMET, Mg,  Ph  Transaminitis Elevated liver enzymes on 2/18 (ASTof 73 and ALT of 91,T.bili of 2.1). Likely result of shock liver. -monitor    Best practice (evaluated daily)  Diet: Tube feed Pain/Anxiety/Delirium protocol (if indicated): NA VAP protocol (if indicated): VAP bundle DVT prophylaxis: SCDs GI prophylaxis: Protonix Mobility: Bed Disposition:ICU  Goals of Care:  Last date of multidisciplinary goals of care discussion: Family and staff present: No family present this AM. Follow up goals of care discussion due:  Code Status: FULL  Labs   CBC: Recent Labs  Lab 01/16/21 0208 01/17/21 1607 01/17/21 2349 01/04/2021 0415 01/14/2021 0556 01/28/2021 2304 01/17/2021 2316 01/03/2021 2325 01/19/21 0219 01/19/21 0426 01/19/21 0520 01/20/21 0610  WBC 4.7  --  6.3  --  5.7 12.1*  --   --   --  12.0*  --  7.7  NEUTROABS 2.8  --  3.7  --   --  10.4*  --   --   --   --   --   --   HGB 12.1*   < > 11.6*   < > 11.4* 13.5   < > 13.3 8.2* 14.2 12.9* 12.1*  HCT 37.2*   < > 32.6*   < > 31.8* 41.6   < > 39.0 24.0* 42.3 38.0* 34.6*  MCV 85.9  --  80.9  --  81.1 86.0  --   --   --  83.4  --  81.6  PLT 136*  --  160  --  168 146*  --   --   --  190  --  164   < > = values in this interval not displayed.    Basic Metabolic Panel: Recent Labs  Lab 01/12/2021 0556 01/17/2021 2304 01/05/2021 2316 01/12/2021 2325 01/19/21 0219 01/19/21 0426 01/19/21 0520 01/19/21 1115 01/19/21 1717 01/20/21 0610 01/21/21 0230  NA 141 141   < > 145 145 142 144  --   --  144 141  K 2.8* 3.5   < > 3.5 4.3 3.4* 3.3*  --   --  3.3* 4.3  CL 107 108  --  105  --  106  --   --   --  110 108  CO2 24 21*  --   --   --  25  --   --   --  25 22  GLUCOSE 94 228*  --  222*  --  112*  --   --   --  116* 104*  BUN 7 8  --  8  --  9  --   --   --  7 13  CREATININE 0.91 1.12  --  1.00  --  0.91  --   --   --  0.68 0.56*  CALCIUM 8.1* 7.8*  --   --   --  7.8*  --   --   --  8.1* 8.2*  MG 1.5*  --   --   --   --  1.5*  --  1.5* 1.5*  2.0 1.8  PHOS 2.4*  --   --   --   --  2.4*  --  2.7 2.9 2.0* 3.1   < > =  values in this interval not displayed.   GFR: Estimated Creatinine Clearance: 169.9 mL/min (A) (by C-G formula based on SCr of 0.56 mg/dL (L)). Recent Labs  Lab February 13, 2021 0556 February 13, 2021 2304 01/19/21 0426 01/19/21 0428 01/20/21 0610  WBC 5.7 12.1* 12.0*  --  7.7  LATICACIDVEN  --  3.5*  --  1.8  --     Liver Function Tests: Recent Labs  Lab 01/15/21 0856 01/16/21 0208 01/17/21 1600 February 13, 2021 0556 February 13, 2021 2304  AST 34 57* 83* 55* 73*  ALT 24 47* 88* 79* 91*  ALKPHOS 50 48 58 51 79  BILITOT 3.4* 2.8* 1.8* 1.9* 2.1*  PROT 5.4* 5.6* 5.4* 5.1* 5.6*  ALBUMIN 3.4* 3.4* 3.4* 3.2* 3.4*   No results for input(s): LIPASE, AMYLASE in the last 168 hours. No results for input(s): AMMONIA in the last 168 hours.  ABG    Component Value Date/Time   PHART 7.433 01/19/2021 0520   PCO2ART 37.1 01/19/2021 0520   PO2ART 509 (H) 01/19/2021 0520   HCO3 25.0 01/19/2021 0520   TCO2 26 01/19/2021 0520   ACIDBASEDEF 1.0 01/19/2021 0219   O2SAT 100.0 01/19/2021 0520     Coagulation Profile: Recent Labs  Lab 01/17/21 1600 02-13-2021 2304 01/19/21 0426  INR 1.2 1.1 1.1    Cardiac Enzymes: No results for input(s): CKTOTAL, CKMB, CKMBINDEX, TROPONINI in the last 168 hours.  HbA1C: Hgb A1c MFr Bld  Date/Time Value Ref Range Status  01/13/2021 05:02 PM 4.5 (L) 4.8 - 5.6 % Final    Comment:    (NOTE) Pre diabetes:          5.7%-6.4%  Diabetes:              >6.4%  Glycemic control for   <7.0% adults with diabetes     CBG: Recent Labs  Lab 01/20/21 1549 01/20/21 2031 01/20/21 2328 01/21/21 0341 01/21/21 0743  GLUCAP 80 71 94 108* 82    Critical care time: The patient is critically ill with multiple organ systems failure and requires high complexity decision making for assessment and support, frequent evaluation and titration of therapies, application of advanced monitoring technologies and extensive  interpretation of multiple databases.  Critical care time 40 mins. This represents my time independent of the NPs time taking care of the pt. This is excluding procedures.    Briant Sites DO [facility] 01/21/2021, 6:25 PM See Amion for pager If no response to pager, please call [phone] until 1900 After 1900 please call [facility] [phone]

## 2021-01-21 NOTE — Progress Notes (Addendum)
Neurology Progress Note   S:// Seen and examined. No acute changes overnight.  No seizure activity.   O:// Current vital signs: BP (!) 151/83   Pulse 71   Temp 98.6 F (37 C) (Core)   Resp 18   Wt 100 kg   SpO2 100%   BMI 29.90 kg/m  Vital signs in last 24 hours: Temp:  [98.6 F (37 C)] 98.6 F (37 C) (02/21 0744) Pulse Rate:  [67-83] 71 (02/21 1130) Resp:  [14-19] 18 (02/21 1130) BP: (130-154)/(77-88) 151/83 (02/21 1130) SpO2:  [100 %] 100 % (02/21 1130) FiO2 (%):  [30 %] 30 % (02/21 1117) Weight:  [100 kg] 100 kg (02/21 0500) General: Intubated, no acute distress HEENT: Normocephalic atraumatic Cardiovascular: Regular rhythm Neurological exam Patient is intubated, he is on sedation with propofol that was held for exam. No spontaneous movement No response to voice Does not follow commands Cranial nerves: Pupils are equal, sluggishly reactive to light, mildly disconjugate gaze, corneal reflex present on the left, corneal reflex absent on the right.  No cough or gag.  Breathing over the ventilator by 2 breaths/min. Motor exam: No spontaneous movement Sensory exam: To noxious stimulation has extensor posturing in all fours mildly.   Medications  Current Facility-Administered Medications:  .  0.9 %  sodium chloride infusion, , Intravenous, Continuous, Deland Pretty, MD, Last Rate: 10 mL/hr at 01/21/21 1100, Infusion Verify at 01/21/21 1100 .  artificial tears (LACRILUBE) ophthalmic ointment, , Both Eyes, Q8H, Chesley Mires, MD, Given at 01/21/21 0544 .  chlorhexidine gluconate (MEDLINE KIT) (PERIDEX) 0.12 % solution 15 mL, 15 mL, Mouth Rinse, BID, Deland Pretty, MD, 15 mL at 01/21/21 0747 .  Chlorhexidine Gluconate Cloth 2 % PADS 6 each, 6 each, Topical, Q0600, Deland Pretty, MD, 6 each at 01/21/21 [phone] .  feeding supplement (PIVOT 1.5 CAL) liquid 1,000 mL, 1,000 mL, Per Tube, Continuous, Sood, Vineet, MD, Last Rate: 40 mL/hr at 01/21/21 0000,  Infusion Verify at 01/21/21 0000 .  feeding supplement (PROSource TF) liquid 45 mL, 45 mL, Per Tube, TID, Chesley Mires, MD, 45 mL at 01/21/21 0936 .  heparin injection 5,000 Units, 5,000 Units, Subcutaneous, Q8H, Deland Pretty, MD, 5,000 Units at 01/21/21 0543 .  levETIRAcetam (KEPPRA) IVPB 1000 mg/100 mL premix, 1,000 mg, Intravenous, Q12H, Kirby-Graham, Karsten Fells, NP, Stopped at 01/21/21 0601 .  MEDLINE mouth rinse, 15 mL, Mouth Rinse, 10 times per day, Deland Pretty, MD, 15 mL at 01/21/21 1130 .  norepinephrine (LEVOPHED) 2m in 2562mpremix infusion, 0-50 mcg/min, Intravenous, Titrated, DoDeland PrettyMD .  pantoprazole sodium (PROTONIX) 40 mg/20 mL oral suspension 40 mg, 40 mg, Per Tube, Q24H, SoChesley MiresMD, 40 mg at 01/21/21 0936 .  polyvinyl alcohol (LIQUIFILM TEARS) 1.4 % ophthalmic solution 1 drop, 1 drop, Both Eyes, PRN, SoChesley MiresMD, 1 drop at 01/20/21 1139 .  propofol (DIPRIVAN) 1000 MG/100ML infusion, 5-80 mcg/kg/min, Intravenous, Continuous, GoSherwood GamblerMD, Last Rate: 15 mL/hr at 01/21/21 1100, 25 mcg/kg/min at 01/21/21 1100 Labs CBC    Component Value Date/Time   WBC 7.7 01/20/2021 0610   RBC 4.24 01/20/2021 0610   HGB 12.1 (L) 01/20/2021 0610   HCT 34.6 (L) 01/20/2021 0610   PLT 164 01/20/2021 0610   MCV 81.6 01/20/2021 0610   MCH 28.5 01/20/2021 0610   MCHC 35.0 01/20/2021 0610   RDW 14.2 01/20/2021 0610   LYMPHSABS 1.1 01/25/2021 2304   MONOABS 0.4 01/12/2021 2304   EOSABS 0.1  01/17/2021 2304   BASOSABS 0.0 01/23/2021 2304    CMP     Component Value Date/Time   NA 141 01/21/2021 0230   K 4.3 01/21/2021 0230   CL 108 01/21/2021 0230   CO2 22 01/21/2021 0230   GLUCOSE 104 (H) 01/21/2021 0230   BUN 13 01/21/2021 0230   CREATININE 0.56 (L) 01/21/2021 0230   CALCIUM 8.2 (L) 01/21/2021 0230   PROT 5.6 (L) 01/08/2021 2304   ALBUMIN 3.4 (L) 01/19/2021 2304   AST 73 (H) 01/14/2021 2304   ALT 91 (H) 01/02/2021 2304   ALKPHOS 79  01/05/2021 2304   BILITOT 2.1 (H) 01/05/2021 2304   GFRNONAA >60 01/21/2021 0230   Imaging I have reviewed images in epic and the results pertinent to this consultation are: CT head on presentation with no acute changes EEG overnight with generalized epileptogenicity as well as severe diffuse encephalopathy.  No definite seizures  Assessment:  Presumed opiate overdose Respiratory depression from overdose requiring intubation. Cardiac arrest that is post CPR Question seizure activity when EMS attended to the patient. EEG with myoclonic seizure activity 01/20/2020, 01/21/2020 Clinical exam extremely poor with some brainstem reflexes and no evidence of cortical function-?  Question some anoxic brain injury as well. Covid positive  Recommendations: Given an additional load of Keppra and increase Keppra to 1500 twice daily. Continue LTM EEG Management of Covid and other medical comorbidities per primary team as you are. We will need an MRI at some point to assess for anoxic/hypoxic injury  Post cardiac arrest, suspicion for anoxic brain injury, poor exam 2 days in as well as myoclonus very soon after cardiac arrest -all of these findings portend poor prognosis in terms of neurologically meaningful recovery.  We will continue to follow. -- Amie Portland, MD Neurologist [facility] Pager: [phone]  CRITICAL CARE ATTESTATION Performed by: Amie Portland, MD Total critical care time: 44 minutes Critical care time was exclusive of separately billable procedures and treating other patients and/or supervising APPs/Residents/Students Critical care was necessary to treat or prevent imminent or life-threatening deterioration due to polysubstance abuse, overdose, possible hypoxic anoxic brain injury, seizure-like activity, myoclonic seizures This patient is critically ill and at significant risk for neurological worsening and/or death and care requires constant monitoring. Critical  care was time spent personally by me on the following activities: development of treatment plan with patient and/or surrogate as well as nursing, discussions with consultants, evaluation of patient's response to treatment, examination of patient, obtaining history from patient or surrogate, ordering and performing treatments and interventions, ordering and review of laboratory studies, ordering and review of radiographic studies, pulse oximetry, re-evaluation of patient's condition, participation in multidisciplinary rounds and medical decision making of high complexity in the care of this patient.

## 2021-01-22 ENCOUNTER — Inpatient Hospital Stay (HOSPITAL_COMMUNITY): Payer: Self-pay

## 2021-01-22 LAB — GLUCOSE, CAPILLARY
Glucose-Capillary: 129 mg/dL — ABNORMAL HIGH (ref 70–99)
Glucose-Capillary: 134 mg/dL — ABNORMAL HIGH (ref 70–99)
Glucose-Capillary: 142 mg/dL — ABNORMAL HIGH (ref 70–99)
Glucose-Capillary: 146 mg/dL — ABNORMAL HIGH (ref 70–99)
Glucose-Capillary: 168 mg/dL — ABNORMAL HIGH (ref 70–99)

## 2021-01-22 MED ORDER — MIDAZOLAM HCL 2 MG/2ML IJ SOLN: 4.0000 mg | Freq: Once | INTRAMUSCULAR | Status: AC

## 2021-01-22 MED ORDER — MIDAZOLAM HCL 2 MG/2ML IJ SOLN
INTRAMUSCULAR | Status: AC
  Administered 2021-01-22: 4 mg via INTRAVENOUS
  Filled 2021-01-22: qty 4

## 2021-01-22 NOTE — Progress Notes (Signed)
Neurology Progress Note   S:// Seen and examined. No acute changes overnight.  No seizure activity.   O:// Current vital signs: BP (!) 147/82   Pulse 71   Temp 98.9 F (37.2 C) (Axillary)   Resp (!) 21   Wt 100 kg   SpO2 94%   BMI 29.90 kg/m  Vital signs in last 24 hours: Temp:  [98.1 F (36.7 C)-101.8 F (38.8 C)] 98.9 F (37.2 C) (02/22 0800) Pulse Rate:  [67-105] 71 (02/22 1000) Resp:  [14-26] 21 (02/22 1000) BP: (139-157)/(77-88) 147/82 (02/22 1000) SpO2:  [93 %-100 %] 94 % (02/22 1000) FiO2 (%):  [30 %] 30 % (02/22 0800) Weight:  [100 kg] 100 kg (02/22 0416) General: Intubated, no acute distress HEENT: Normocephalic atraumatic Cardiovascular: Regular rhythm Neurological exam Patient is intubated, he is on sedation with propofol that was held for exam. No spontaneous movement No response to voice Does not follow commands Cranial nerves: Pupils are equal, somewhat more reactive than yesterday to light gaze remains mildly disconjugate gaze, corneal reflex present on the left, corneal reflex absent on the right.  No cough or gag.   Motor exam: No spontaneous movement Sensory exam: To noxious stimulation has extensor posturing in all fours mildly.   Medications  Current Facility-Administered Medications:  .  0.9 %  sodium chloride infusion, , Intravenous, Continuous, Deland Pretty, MD, Stopped at 01/22/21 [phone]  artificial tears (LACRILUBE) ophthalmic ointment, , Both Eyes, Q8H, Chesley Mires, MD, Given at 01/22/21 [phone]  chlorhexidine gluconate (MEDLINE KIT) (PERIDEX) 0.12 % solution 15 mL, 15 mL, Mouth Rinse, BID, Deland Pretty, MD, 15 mL at 01/22/21 0800 .  Chlorhexidine Gluconate Cloth 2 % PADS 6 each, 6 each, Topical, Q0600, Deland Pretty, MD, 6 each at 01/22/21 [phone]  feeding supplement (PIVOT 1.5 CAL) liquid 1,000 mL, 1,000 mL, Per Tube, Continuous, Sood, Vineet, MD, Last Rate: 50 mL/hr at 01/22/21 0000, Infusion Verify at 01/22/21  0000 .  feeding supplement (PROSource TF) liquid 45 mL, 45 mL, Per Tube, TID, Chesley Mires, MD, 45 mL at 01/22/21 0951 .  heparin injection 5,000 Units, 5,000 Units, Subcutaneous, Q8H, Deland Pretty, MD, 5,000 Units at 01/22/21 [phone] .  levETIRAcetam (KEPPRA) IVPB 1000 mg/100 mL premix, 1,000 mg, Intravenous, Q12H, Kirby-Graham, Karsten Fells, NP, Last Rate: 400 mL/hr at 01/22/21 0600, Infusion Verify at 01/22/21 0600 .  MEDLINE mouth rinse, 15 mL, Mouth Rinse, 10 times per day, Deland Pretty, MD, 15 mL at 01/22/21 0951 .  pantoprazole sodium (PROTONIX) 40 mg/20 mL oral suspension 40 mg, 40 mg, Per Tube, Q24H, Chesley Mires, MD, 40 mg at 01/22/21 0951 .  polyvinyl alcohol (LIQUIFILM TEARS) 1.4 % ophthalmic solution 1 drop, 1 drop, Both Eyes, PRN, Chesley Mires, MD, 1 drop at 01/20/21 1139 .  propofol (DIPRIVAN) 1000 MG/100ML infusion, 5-80 mcg/kg/min, Intravenous, Continuous, Sherwood Gambler, MD, Last Rate: 6 mL/hr at 01/22/21 0600, 10 mcg/kg/min at 01/22/21 0600 Labs CBC    Component Value Date/Time   WBC 7.7 01/20/2021 0610   RBC 4.24 01/20/2021 0610   HGB 12.1 (L) 01/20/2021 0610   HCT 34.6 (L) 01/20/2021 0610   PLT 164 01/20/2021 0610   MCV 81.6 01/20/2021 0610   MCH 28.5 01/20/2021 0610   MCHC 35.0 01/20/2021 0610   RDW 14.2 01/20/2021 0610   LYMPHSABS 1.1 01/24/2021 2304   MONOABS 0.4 01/12/2021 2304   EOSABS 0.1 01/12/2021 2304   BASOSABS 0.0 01/21/2021 2304    CMP  Component Value Date/Time   NA 141 01/21/2021 0230   K 4.3 01/21/2021 0230   CL 108 01/21/2021 0230   CO2 22 01/21/2021 0230   GLUCOSE 104 (H) 01/21/2021 0230   BUN 13 01/21/2021 0230   CREATININE 0.56 (L) 01/21/2021 0230   CALCIUM 8.2 (L) 01/21/2021 0230   PROT 5.6 (L) 01/15/2021 2304   ALBUMIN 3.4 (L) 01/10/2021 2304   AST 73 (H) 01/08/2021 2304   ALT 91 (H) 01/27/2021 2304   ALKPHOS 79 01/02/2021 2304   BILITOT 2.1 (H) 01/21/2021 2304   GFRNONAA >60 01/21/2021 0230   Imaging I have  reviewed images in epic and the results pertinent to this consultation are: CT head on presentation with no acute changes EEG overnight with generalized epileptogenicity as well as severe diffuse encephalopathy.  No definite seizures  Assessment:  Presumed opiate overdose Respiratory depression from overdose requiring intubation. Cardiac arrest that is post CPR Question seizure activity when EMS attended to the patient. EEG with myoclonic seizure activity 01/20/2020, 01/21/2020. Overnight EEG report pending. Clinical exam extremely poor with some brainstem reflexes and no evidence of cortical function-?  Question some anoxic brain injury as well. Covid positive  Recommendations: Continue current antiepileptics Continue LTM EEG until report is read. If there is no concern for any abnormality, I would discontinue the EEG. Management of Covid and other medical comorbidities per primary team as you are. MRI brain without contrast to assess for anoxic/hypoxic brain injury  Post cardiac arrest, suspicion for anoxic brain injury, poor exam 3 days in as well as myoclonus very soon after cardiac arrest -all of these findings portend poor prognosis in terms of neurologically meaningful recovery.  We will continue to follow. -- Amie Portland, MD Neurologist [facility] Pager: [phone]  CRITICAL CARE ATTESTATION Performed by: Amie Portland, MD Total critical care time: 30 minutes Critical care time was exclusive of separately billable procedures and treating other patients and/or supervising APPs/Residents/Students Critical care was necessary to treat or prevent imminent or life-threatening deterioration due to polysubstance abuse, overdose, possible hypoxic anoxic brain injury, seizure-like activity, myoclonic seizures This patient is critically ill and at significant risk for neurological worsening and/or death and care requires constant monitoring. Critical care was time spent  personally by me on the following activities: development of treatment plan with patient and/or surrogate as well as nursing, discussions with consultants, evaluation of patient's response to treatment, examination of patient, obtaining history from patient or surrogate, ordering and performing treatments and interventions, ordering and review of laboratory studies, ordering and review of radiographic studies, pulse oximetry, re-evaluation of patient's condition, participation in multidisciplinary rounds and medical decision making of high complexity in the care of this patient.

## 2021-01-22 NOTE — Progress Notes (Signed)
EEG d/c received.

## 2021-01-22 NOTE — Progress Notes (Signed)
vLTM EEG complete no skin breakdown 

## 2021-01-22 NOTE — Progress Notes (Signed)
RT NOTES: Transported to MRI and back to room 3M09 on vent without incident.

## 2021-01-22 NOTE — Progress Notes (Signed)
EEG with no sharps or sz. Can d/c LTM

## 2021-01-22 NOTE — Progress Notes (Addendum)
NAME:  James Li, MRN:  [id number], DOB:  1991/11/30, LOS: 3 ADMISSION DATE:  02/11/2021,  REFERRING MD:  EDP, CHIEF COMPLAINT:  Acute Respiratory Distress   Brief History:  30 yo male with repeated episodes of drug overdose leading to VDRF.  This time he required CPR from PEA with ROSC after 15 minutes. Third overdose with fentanyl in a relatively short period of time.  History of Present Illness:  30 yo with h/o homelessness, drug abuse presented repeatedly for opioid overdose.   Past Medical History:  Polysubstance abuse  Significant Hospital Events:  2/17 Admitted 2/18 sign out AMA, brought back to ER with PEA cardiac arrest, start normothermia 2/19 developed myoclonic seizures 2/22 Normothermia  Consults:  Neurology  Procedures:  ETT 2/17 >> 2/18 ETT 2/18 >>   Significant Diagnostic Tests:   EEG 2/17 >> generalized slowing  CT head 2/17 >> no acute findings  CT head 2/19 >> no acute findings  EEG 2/19 >> generalized myoclonic seizures, periodic epileptiform discharges, background suppression and slowing  Echo 2/20 >> EF 60-65% normal LV, RV function, elevated RA pressure at 15   Micro Data:  COVID PCR 2/13 >> Positive Blood 2/21>> Blood 2/19 >> No growth for 2 days Blood 2/17>> No growth for 3 days  Antimicrobials:     Interim History / Subjective:  2/22: Pt remains on LTM without seizure but generalized epileptogenicity noted, neuro following. Eyes disconjugate with right lateral gaze in right eye, central for left eye; no movement otherwise. Minimal vent settings. Temp overnight, no cooling blanket, but ice used to effect.  Objective   Blood pressure (!) 151/77, pulse 70, temperature 99 F (37.2 C), temperature source Axillary, resp. rate 17, weight 100 kg, SpO2 96 %.    Vent Mode: PRVC FiO2 (%):  [30 %] 30 % Set Rate:  [14 bmp] 14 bmp Vt Set:  [620 mL] 620 mL PEEP:  [5 cmH20] 5 cmH20 Plateau Pressure:  [17 cmH20-21 cmH20] 17 cmH20    Intake/Output Summary (Last 24 hours) at 01/22/2021 0805 Last data filed at 01/22/2021 0600 Gross per 24 hour  Intake 1241.41 ml  Output 907.5 ml  Net 333.91 ml   Filed Weights   01/21/21 0500 01/22/21 0416  Weight: 100 kg 100 kg    Examination: General: On vent, on diprivan gtt, and on ice HENT: Pupils flicker response; right eye disconjugate gaze to the right, left eye midline. Lungs: CTA bilaterally; no wheezes, rales Cardiovascular: RRR,no appreciable murmur;  Warm BUE and cap refill <3 seconds, BLE cool Abdomen: soft, non-tender; positive bowel sounds;  Extremities: no cyanosis; no edema BUE/BLE Neuro: Not following commands;  GU: Foley Psych: Unable to assess  Resolved Hospital Problem list     Assessment & Plan:  Acute metabolic/anoxic encephalopathy from cardiac arrest in setting of repeated accidental overdose with hx of substance abuse. 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 - normothermia >> protocol completion on 2/21  -await 72 hrs after normothermia to attempt clearer picture of prognosis - d/c LTM - MRI today per neuro - AEDs per neuro - wean diprivan as able   PEA cardiac arrest from overdose. - Echo unremarkable. EF of 60-65%, no ventricle dysfunction, but elevated R A pressure at 15. Inferior vena cava dilated with <50% respiratory variability -hemodynamically stable  Compromised airway in setting of overdose. Chest xray on 2/20 improving bilateral upper lobe opacities. - full vent support -diprivan gtt -can sbt based on vent settings but mental status precluding extubation  COVID 19 positive. - from 01/13/21 - d/c airborne precautions with negative cxr and minimal vent settings 10 days since positive - no therapy at this time  Hypokalemia, hypophosphatemia, hypomagnesemia. - f/u BMET, Mg, Ph  Transaminitis Elevated liver enzymes on 2/18 (ASTof 73 and ALT of 91,T.bili of 2.1). Likely result of shock liver. -monitor    Best  practice (evaluated daily)  Diet: Tube feed Pain/Anxiety/Delirium protocol (if indicated): NA VAP protocol (if indicated): VAP bundle DVT prophylaxis: SCDs GI prophylaxis: Protonix Mobility: Bed Disposition:ICU  Goals of Care:  Last date of multidisciplinary goals of care discussion:2/21 with father Family and staff present: No family present this AM; plan is for father coming today Follow up goals of care discussion due: 2/23 after mri obtained Code Status: FULL    Labs   CBC: Recent Labs  Lab 01/16/21 0208 01/17/21 1607 01/17/21 2349 2021/02/08 0415 08-Feb-2021 0556 02/08/21 2304 02/08/21 2316 08-Feb-2021 2325 01/19/21 0219 01/19/21 0426 01/19/21 0520 01/20/21 0610  WBC 4.7  --  6.3  --  5.7 12.1*  --   --   --  12.0*  --  7.7  NEUTROABS 2.8  --  3.7  --   --  10.4*  --   --   --   --   --   --   HGB 12.1*   < > 11.6*   < > 11.4* 13.5   < > 13.3 8.2* 14.2 12.9* 12.1*  HCT 37.2*   < > 32.6*   < > 31.8* 41.6   < > 39.0 24.0* 42.3 38.0* 34.6*  MCV 85.9  --  80.9  --  81.1 86.0  --   --   --  83.4  --  81.6  PLT 136*  --  160  --  168 146*  --   --   --  190  --  164   < > = values in this interval not displayed.    Basic Metabolic Panel: Recent Labs  Lab 02-08-2021 0556 Feb 08, 2021 2304 2021-02-08 2316 2021/02/08 2325 01/19/21 0219 01/19/21 0426 01/19/21 0520 01/19/21 1115 01/19/21 1717 01/20/21 0610 01/21/21 0230  NA 141 141   < > 145 145 142 144  --   --  144 141  K 2.8* 3.5   < > 3.5 4.3 3.4* 3.3*  --   --  3.3* 4.3  CL 107 108  --  105  --  106  --   --   --  110 108  CO2 24 21*  --   --   --  25  --   --   --  25 22  GLUCOSE 94 228*  --  222*  --  112*  --   --   --  116* 104*  BUN 7 8  --  8  --  9  --   --   --  7 13  CREATININE 0.91 1.12  --  1.00  --  0.91  --   --   --  0.68 0.56*  CALCIUM 8.1* 7.8*  --   --   --  7.8*  --   --   --  8.1* 8.2*  MG 1.5*  --   --   --   --  1.5*  --  1.5* 1.5* 2.0 1.8  PHOS 2.4*  --   --   --   --  2.4*  --  2.7 2.9 2.0* 3.1    < > =  values in this interval not displayed.   GFR: Estimated Creatinine Clearance: 169.9 mL/min (A) (by C-G formula based on SCr of 0.56 mg/dL (L)). Recent Labs  Lab 01/03/2021 0556 01/22/2021 2304 01/19/21 0426 01/19/21 0428 01/20/21 0610  WBC 5.7 12.1* 12.0*  --  7.7  LATICACIDVEN  --  3.5*  --  1.8  --     Liver Function Tests: Recent Labs  Lab 01/15/21 0856 01/16/21 0208 01/17/21 1600 01/24/2021 0556 01/08/2021 2304  AST 34 57* 83* 55* 73*  ALT 24 47* 88* 79* 91*  ALKPHOS 50 48 58 51 79  BILITOT 3.4* 2.8* 1.8* 1.9* 2.1*  PROT 5.4* 5.6* 5.4* 5.1* 5.6*  ALBUMIN 3.4* 3.4* 3.4* 3.2* 3.4*   No results for input(s): LIPASE, AMYLASE in the last 168 hours. No results for input(s): AMMONIA in the last 168 hours.  ABG    Component Value Date/Time   PHART 7.433 01/19/2021 0520   PCO2ART 37.1 01/19/2021 0520   PO2ART 509 (H) 01/19/2021 0520   HCO3 25.0 01/19/2021 0520   TCO2 26 01/19/2021 0520   ACIDBASEDEF 1.0 01/19/2021 0219   O2SAT 100.0 01/19/2021 0520     Coagulation Profile: Recent Labs  Lab 01/17/21 1600 01/12/2021 2304 01/19/21 0426  INR 1.2 1.1 1.1    Cardiac Enzymes: No results for input(s): CKTOTAL, CKMB, CKMBINDEX, TROPONINI in the last 168 hours.  HbA1C: Hgb A1c MFr Bld  Date/Time Value Ref Range Status  01/13/2021 05:02 PM 4.5 (L) 4.8 - 5.6 % Final    Comment:    (NOTE) Pre diabetes:          5.7%-6.4%  Diabetes:              >6.4%  Glycemic control for   <7.0% adults with diabetes     CBG: Recent Labs  Lab 01/21/21 1129 01/21/21 1529 01/21/21 1956 01/21/21 2335 01/22/21 0330  GLUCAP 81 96 114* 148* 134*    Critical care time:    Critical care time: The patient is critically ill with multiple organ systems failure and requires high complexity decision making for assessment and support, frequent evaluation and titration of therapies, application of advanced monitoring technologies and extensive interpretation of multiple databases.   Critical care time 37 mins. This represents my time independent of the NPs time taking care of the pt. This is excluding procedures.    Briant Sites DO [facility] 01/22/2021, 3:03 PM See Amion for pager If no response to pager, please call [phone] until 1900 After 1900 please call [facility] [phone]

## 2021-01-22 NOTE — Procedures (Addendum)
Patient Name:Euan Milt Coye OEV:[id number] Epilepsy Attending:Quindarrius Joplin Annabelle Harman Referring Physician/Provider:Dr Glyn Ade Duration:01/21/2021 1312 to 2/22/20221203  Patient history:29yo M s/p cardiac arrest. EEG to evaluate for seizure  Level of alertness:Comatose  AEDs during EEG study:Propofol, LEV  Technical aspects: This EEG study was done with scalp electrodes positioned according to the 10-20 International system of electrode placement. Electrical activity was acquired at a sampling rate of 500Hz  and reviewed with a high frequency filter of 70Hz  and a low frequency filter of 1Hz . EEG data were recorded continuously and digitally stored.   Description:EEGshowed continuous generalized low amplitude 2-3Hz  delta slowing admixed with generalized and maximal bifrontal spikes.   ABNORMALITY - Spike, generalized and maximal bifrontal - Continuous slow, generalized  IMPRESSION: This study showed evidence of generalized epileptogenicity as well as severe diffuse encephalopathy. No definite seizures were seen during the study.  Maebelle Sulton 

## 2021-01-23 LAB — CULTURE, BLOOD (ROUTINE X 2)
Culture: NO GROWTH
Culture: NO GROWTH
Special Requests: ADEQUATE
Special Requests: ADEQUATE

## 2021-01-23 LAB — GLUCOSE, CAPILLARY
Glucose-Capillary: 132 mg/dL — ABNORMAL HIGH (ref 70–99)
Glucose-Capillary: 133 mg/dL — ABNORMAL HIGH (ref 70–99)
Glucose-Capillary: 141 mg/dL — ABNORMAL HIGH (ref 70–99)
Glucose-Capillary: 150 mg/dL — ABNORMAL HIGH (ref 70–99)
Glucose-Capillary: 159 mg/dL — ABNORMAL HIGH (ref 70–99)

## 2021-01-23 NOTE — Progress Notes (Signed)
Neurology Progress Note  S: No overnight events. Off diprivan now.  Still no clinical reactions per nursing. No seizure like activity.   O: Current vital signs: BP 120/63    Pulse 77    Temp 98.6 F (37 C) (Oral)    Resp 14    Wt 100 kg    SpO2 96%    BMI 29.90 kg/m  Vital signs in last 24 hours: Temp:  [98.4 F (36.9 C)-100 F (37.8 C)] 98.6 F (37 C) (02/23 0800) Pulse Rate:  [61-83] 77 (02/23 0900) Resp:  [14-30] 14 (02/23 0900) BP: (115-180)/(59-96) 120/63 (02/23 0900) SpO2:  [90 %-100 %] 96 % (02/23 0900) FiO2 (%):  [30 %-60 %] 50 % (02/23 0815) Weight:  [100 kg] 100 kg (02/23 0426)  GENERAL: comatose HEENT: Normocephalic and atraumatic. Intubated LUNGS: Ventilator CV: RRR on tele.  ABDOMEN: Soft, nontender Ext: warm  NEURO:  Mental Status: comatose, unresponsive Speech/Language: unresponsive. No sounds.  Cranial Nerves:  BS:WHQPRF 33m fixed dilated III, IV, VI: eyes slightly dysconjugate. No nystagmus V, VII: + corneal reflex OS, absent on OD.  VIII: no response to name calling, loud clapping or noxious stimuli IX, X: no cough or gag with use of suction catheter.  XI: head grossly midline XII: intubated Motor/sensory: no response to tactile stimulation or noxious stimuli. No purposeful movement to noxious stimuli.  DTRs-absent x 4 extremities Babinski absent. Toes downgoing bilaterally Cerebellar: no tremors or clonus.   Medications  Current Facility-Administered Medications:    0.9 %  sodium chloride infusion, , Intravenous, Continuous, DDeland Pretty MD, Stopped at 01/22/21 [phone]  artificial tears (LACRILUBE) ophthalmic ointment, , Both Eyes, Q8H, SChesley Mires MD, Given at 01/23/21 0518   chlorhexidine gluconate (MEDLINE KIT) (PERIDEX) 0.12 % solution 15 mL, 15 mL, Mouth Rinse, BID, DDeland Pretty MD, 15 mL at 01/23/21 04665  Chlorhexidine Gluconate Cloth 2 % PADS 6 each, 6 each, Topical, Q0600, DDeland Pretty MD, 6 each at  01/23/21 0518   feeding supplement (PIVOT 1.5 CAL) liquid 1,000 mL, 1,000 mL, Per Tube, Continuous, SChesley Mires MD, Last Rate: 50 mL/hr at 01/23/21 0400, Infusion Verify at 01/23/21 0400   feeding supplement (PROSource TF) liquid 45 mL, 45 mL, Per Tube, TID, SChesley Mires MD, 45 mL at 01/23/21 0900   heparin injection 5,000 Units, 5,000 Units, Subcutaneous, Q8H, DDeland Pretty MD, 5,000 Units at 01/23/21 0518   levETIRAcetam (KEPPRA) IVPB 1000 mg/100 mL premix, 1,000 mg, Intravenous, Q12H, Kirby-Graham, KKarsten Fells NP, Stopped at 01/23/21 0536   MEDLINE mouth rinse, 15 mL, Mouth Rinse, 10 times per day, DDeland Pretty MD, 15 mL at 01/23/21 0900   pantoprazole sodium (PROTONIX) 40 mg/20 mL oral suspension 40 mg, 40 mg, Per Tube, Q24H, Sood, Vineet, MD, 40 mg at 01/23/21 0900   polyvinyl alcohol (LIQUIFILM TEARS) 1.4 % ophthalmic solution 1 drop, 1 drop, Both Eyes, PRN, SHalford Chessman Vineet, MD, 1 drop at 01/20/21 1139   propofol (DIPRIVAN) 1000 MG/100ML infusion, 5-80 mcg/kg/min, Intravenous, Continuous, GSherwood Gambler MD, Stopped at 01/22/21 1042  Pertinent Labs CBG 150     Creat .56    Hgb 12.1  Imaging MD has reviewed images in epic and the results pertinent to this consultation:  MRI Brain 01/22/21 Severe, diffuse cortical and deep gray matter hypoxic ischemic Injury  EEG 01/22/21 Generalized epileptogenicity and severe diffuse encephalopathy.    Assessment: 30yo male who presented with presumed Fentanyl overdose with respiratory depression requiring intubation. PEA arrest  as well with ? seizure activity by EMS. EEG showed myoclonic seizure activity on 2/19 and 2/20, but showed generalized epileptogenicity and severe diffuse encephalopathy, but not seizures. 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 Unfortunately, he has not improved on exam over past few days with no evidence of cortical function and exam suggesting minimal  brainstem activity.   Plan:  1. Continue management of medical co morbidities per primary team as you are.  2. Myoclonic seizure activity-off LTM, continue Keppra.  3. Given poor exam 4 days and off Propofol, in as well as myoclonus very soon after cardiac arrest, these findings portend poor prognosis of neurologically meaningful recovery. Family is coming later in the evening. Will discuss with them as well. Suspect he will progress to brain death eventually. Currently still has a corneal reflex and pulled a couple breaths over the vent so not brain dead.    Pt seen by Clance Boll, MSN, APN-BC/Nurse Practitioner/Neuro and later by MD. Note and plan to be edited as needed by MD.  Pager: [phone]  Attending Neurohospitalist Addendum Patient seen and examined with APP/Resident. Agree with the history and physical as documented above. Agree with the plan as documented, which I helped formulate. I have independently reviewed the chart, obtained history, review of systems and examined the patient.I have personally reviewed pertinent head/neck/spine imaging (CT/MRI). D/W Dr Ruthann Cancer Please feel free to call with any questions.  -- Amie Portland, MD Neurologist [facility] Pager: [phone]  CRITICAL CARE ATTESTATION Performed by: Amie Portland, MD Total critical care time: 35 minutes Critical care time was exclusive of separately billable procedures and treating other patients and/or supervising APPs/Residents/Students Critical care was necessary to treat or prevent imminent or life-threatening deterioration due to anoxic brain injury  This patient is critically ill and at significant risk for neurological worsening and/or death and care requires constant monitoring. Critical care was time spent personally by me on the following activities: development of treatment plan with patient and/or surrogate as well as nursing, discussions with consultants, evaluation of  patient's response to treatment, examination of patient, obtaining history from patient or surrogate, ordering and performing treatments and interventions, ordering and review of laboratory studies, ordering and review of radiographic studies, pulse oximetry, re-evaluation of patient's condition, participation in multidisciplinary rounds and medical decision making of high complexity in the care of this patient.

## 2021-01-23 NOTE — Progress Notes (Addendum)
NAME:  James Li, MRN:  [id number], DOB:  [birth year], LOS: 4 ADMISSION DATE:  01/22/2021,  REFERRING MD:  EDP, CHIEF COMPLAINT:  Acute Respiratory Distress, cardiac arrest   Brief History:  30 yo male with repeated episodes of drug overdose leading to VDRF.  This time he required CPR from PEA with ROSC after 15 minutes. Third overdose with fentanyl in a relatively short period of time.  History of Present Illness:  30 yo with h/o homelessness, drug abuse presented repeatedly for opioid overdose.   Past Medical History:  Polysubstance abuse  Significant Hospital Events:  2/17 Admitted 2/18 sign out AMA, brought back to ER with PEA cardiac arrest, start normothermia 2/19 developed myoclonic seizures 2/22 Normothermia, off sedation  Consults:  Neurology  Procedures:  ETT 2/17 >> 2/18 ETT 2/18 >>   Significant Diagnostic Tests:   EEG 2/17 >> generalized slowing  CT head 2/17 >> no acute findings  CT head 2/19 >> no acute findings  EEG 2/19 >> generalized myoclonic seizures, periodic epileptiform discharges, background suppression and slowing  Echo 2/20 >> EF 60-65% normal LV, RV function, elevated RA pressure at 15  MRI 2/22 >> Severe, 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   Micro Data:  COVID PCR 2/13 >> Positive Blood 2/21>> No growth for two days Blood 2/19 >> No growth for 4 days Blood 2/17>> No growth for 5 days  Antimicrobials:     Interim History / Subjective:  2/23: Patient unresponsive, off LTM, neuro following. Eyes conjugate with fixed pupils this am. Increased vent settings. no temps overnight.   Girlfriend visited last night per nursing. Attempting to have family meeting today in light of mri result  Objective   Blood pressure 126/61, pulse 76, temperature 98.6 F (37 C), temperature source Oral, resp. rate 14, weight 100 kg, SpO2 95 %.    Vent Mode: PRVC FiO2 (%):  [30 %-60 %] 50 % Set Rate:  [14 bmp] 14  bmp Vt Set:  [620 mL] 620 mL PEEP:  [5 cmH20] 5 cmH20 Plateau Pressure:  [15 cmH20-21 cmH20] 17 cmH20   Intake/Output Summary (Last 24 hours) at 01/23/2021 0902 Last data filed at 01/23/2021 0600 Gross per 24 hour  Intake 258.57 ml  Output 1035 ml  Net -776.43 ml   Filed Weights   01/21/21 0500 01/22/21 0416 01/23/21 0426  Weight: 100 kg 100 kg 100 kg    Examination: General: On vent, off diprivan, restful/ unresponsive, intubated HENT: Pupils without flicker response; both eyes midline, fixed pupils at 33mm. Lungs: CTA bilaterally; no wheezes, rales Cardiovascular: RRR,no appreciable murmur;  Warm BUE and cap refill <3 seconds, BLE warm. Edema all extremities 2+. Abdomen: soft, non-tender; decreased bowel sounds;  Extremities: no cyanosis; no edema BUE/BLE Neuro: Not following commands; no gag reflex, no purposeful movements, no babinski bilaterally GU: Foley Psych: Unable to assess  Resolved Hospital Problem list     Assessment & Plan:  Acute metabolic/anoxic encephalopathy from cardiac arrest in setting of repeated accidental overdose with hx of substance abuse. 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 - normothermia >> protocol completion on 2/21 - diprivan gtt off  -await 72 hrs after normothermia to attempt clearer picture of prognosis - MRI 2/22 demonstrated severe, 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 - AEDs per neuro -goals of care discussion with father, girlfriend today   PEA cardiac arrest from overdose. - Echo unremarkable. EF of 60-65%, no ventricle dysfunction, but elevated RA pressure at  15. Inferior vena cava dilated with <50% respiratory variability -hemodynamically stable  Compromised airway in setting of overdose. Chest xray on 2/20 improving bilateral upper lobe opacities. - full vent support, increased Fio2 to 50% -can sbt based on vent settings but mental status precluding extubation  COVID 19 positive - from  01/13/21 - off airborne precautions with negative cxr and minimal vent settings 10 days since positive - no therapy at this time  Hypokalemia, hypophosphatemia, hypomagnesemia. - f/u BMET, Mg, Ph  Transaminitis Elevated liver enzymes on 2/18 (ASTof 73 and ALT of 91,T.bili of 2.1). Likely result of shock liver. -monitor    Best practice (evaluated daily)  Diet: Tube feed Pain/Anxiety/Delirium protocol (if indicated): NA VAP protocol (if indicated): VAP bundle DVT prophylaxis: SCDs GI prophylaxis: Protonix Mobility: Bed Disposition:ICU  Goals of Care:  Last date of multidisciplinary goals of care discussion: plan is for 2/23 with father Family and staff present: No family present this AM Follow up goals of care discussion due:TBD 2/23 Code Status: FULL   Labs   CBC: Recent Labs  Lab 01/17/21 2349 Feb 13, 2021 0415 02-13-2021 0556 02/13/2021 2304 13-Feb-2021 2316 2021/02/13 2325 01/19/21 0219 01/19/21 0426 01/19/21 0520 01/20/21 0610  WBC 6.3  --  5.7 12.1*  --   --   --  12.0*  --  7.7  NEUTROABS 3.7  --   --  10.4*  --   --   --   --   --   --   HGB 11.6*   < > 11.4* 13.5   < > 13.3 8.2* 14.2 12.9* 12.1*  HCT 32.6*   < > 31.8* 41.6   < > 39.0 24.0* 42.3 38.0* 34.6*  MCV 80.9  --  81.1 86.0  --   --   --  83.4  --  81.6  PLT 160  --  168 146*  --   --   --  190  --  164   < > = values in this interval not displayed.    Basic Metabolic Panel: Recent Labs  Lab 02-13-2021 0556 2021-02-13 2304 02-13-21 2316 13-Feb-2021 2325 01/19/21 0219 01/19/21 0426 01/19/21 0520 01/19/21 1115 01/19/21 1717 01/20/21 0610 01/21/21 0230  NA 141 141   < > 145 145 142 144  --   --  144 141  K 2.8* 3.5   < > 3.5 4.3 3.4* 3.3*  --   --  3.3* 4.3  CL 107 108  --  105  --  106  --   --   --  110 108  CO2 24 21*  --   --   --  25  --   --   --  25 22  GLUCOSE 94 228*  --  222*  --  112*  --   --   --  116* 104*  BUN 7 8  --  8  --  9  --   --   --  7 13  CREATININE 0.91 1.12  --  1.00  --  0.91   --   --   --  0.68 0.56*  CALCIUM 8.1* 7.8*  --   --   --  7.8*  --   --   --  8.1* 8.2*  MG 1.5*  --   --   --   --  1.5*  --  1.5* 1.5* 2.0 1.8  PHOS 2.4*  --   --   --   --  2.4*  --  2.7 2.9 2.0* 3.1   < > = values in this interval not displayed.   GFR: Estimated Creatinine Clearance: 169.9 mL/min (A) (by C-G formula based on SCr of 0.56 mg/dL (L)). Recent Labs  Lab 01/08/2021 0556 01/14/2021 2304 01/19/21 0426 01/19/21 0428 01/20/21 0610  WBC 5.7 12.1* 12.0*  --  7.7  LATICACIDVEN  --  3.5*  --  1.8  --     Liver Function Tests: Recent Labs  Lab 01/17/21 1600 01/17/2021 0556 01/25/2021 2304  AST 83* 55* 73*  ALT 88* 79* 91*  ALKPHOS 58 51 79  BILITOT 1.8* 1.9* 2.1*  PROT 5.4* 5.1* 5.6*  ALBUMIN 3.4* 3.2* 3.4*   No results for input(s): LIPASE, AMYLASE in the last 168 hours. No results for input(s): AMMONIA in the last 168 hours.  ABG    Component Value Date/Time   PHART 7.433 01/19/2021 0520   PCO2ART 37.1 01/19/2021 0520   PO2ART 509 (H) 01/19/2021 0520   HCO3 25.0 01/19/2021 0520   TCO2 26 01/19/2021 0520   ACIDBASEDEF 1.0 01/19/2021 0219   O2SAT 100.0 01/19/2021 0520     Coagulation Profile: Recent Labs  Lab 01/17/21 1600 01/08/2021 2304 01/19/21 0426  INR 1.2 1.1 1.1    Cardiac Enzymes: No results for input(s): CKTOTAL, CKMB, CKMBINDEX, TROPONINI in the last 168 hours.  HbA1C: Hgb A1c MFr Bld  Date/Time Value Ref Range Status  01/13/2021 05:02 PM 4.5 (L) 4.8 - 5.6 % Final    Comment:    (NOTE) Pre diabetes:          5.7%-6.4%  Diabetes:              >6.4%  Glycemic control for   <7.0% adults with diabetes     CBG: Recent Labs  Lab 01/22/21 1220 01/22/21 1949 01/22/21 2348 01/23/21 0332 01/23/21 0751  GLUCAP 129* 168* 146* 133* 150*    Critical care time: The patient is critically ill with multiple organ systems failure and requires high complexity decision making for assessment and support, frequent evaluation and titration of  therapies, application of advanced monitoring technologies and extensive interpretation of multiple databases.  Critical care time 39 mins. This represents my time independent of the NPs time taking care of the pt. This is excluding procedures.    Briant Sites DO [facility] 01/23/2021, 9:32 AM See Amion for pager If no response to pager, please call [phone] until 1900 After 1900 please call [facility] [phone]

## 2021-01-23 NOTE — Progress Notes (Signed)
Family meeting note Met with the family-father and siblings. Explained and described the current examination and the assessment with poor prognosis from a neurological standpoint. Family contemplating next steps. I will be available for any further meetings as needed. I will follow him with you again tomorrow. Dr. Ruthann Cancer and I were present in the meeting along with patient's bedside RN Veto Kemps.  -- Amie Portland, MD Neurologist [facility] Pager: [phone]

## 2021-01-24 DIAGNOSIS — J9601 Acute respiratory failure with hypoxia: Secondary | ICD-10-CM

## 2021-01-24 LAB — POCT I-STAT 7, (LYTES, BLD GAS, ICA,H+H)
Acid-Base Excess: 6 mmol/L — ABNORMAL HIGH (ref 0.0–2.0)
Bicarbonate: 32.9 mmol/L — ABNORMAL HIGH (ref 20.0–28.0)
Calcium, Ion: 1.22 mmol/L (ref 1.15–1.40)
HCT: 33 % — ABNORMAL LOW (ref 39.0–52.0)
Hemoglobin: 11.2 g/dL — ABNORMAL LOW (ref 13.0–17.0)
O2 Saturation: 95 %
Patient temperature: 37.2
Potassium: 3.9 mmol/L (ref 3.5–5.1)
Sodium: 140 mmol/L (ref 135–145)
TCO2: 35 mmol/L — ABNORMAL HIGH (ref 22–32)
pCO2 arterial: 59.6 mmHg — ABNORMAL HIGH (ref 32.0–48.0)
pH, Arterial: 7.35 (ref 7.350–7.450)
pO2, Arterial: 85 mmHg (ref 83.0–108.0)

## 2021-01-24 LAB — CULTURE, BLOOD (ROUTINE X 2)
Culture: NO GROWTH
Culture: NO GROWTH
Special Requests: ADEQUATE

## 2021-01-24 LAB — GLUCOSE, CAPILLARY
Glucose-Capillary: 110 mg/dL — ABNORMAL HIGH (ref 70–99)
Glucose-Capillary: 134 mg/dL — ABNORMAL HIGH (ref 70–99)
Glucose-Capillary: 149 mg/dL — ABNORMAL HIGH (ref 70–99)
Glucose-Capillary: 149 mg/dL — ABNORMAL HIGH (ref 70–99)
Glucose-Capillary: 152 mg/dL — ABNORMAL HIGH (ref 70–99)
Glucose-Capillary: 160 mg/dL — ABNORMAL HIGH (ref 70–99)

## 2021-01-24 MED ORDER — VITAL 1.5 CAL PO LIQD
1000.0000 mL | ORAL | Status: DC
  Administered 2021-01-24 – 2021-01-25 (×2): 1000 mL
  Filled 2021-01-24: qty 1000

## 2021-01-24 NOTE — Progress Notes (Addendum)
Neurology progress note  Subjective: No acute overnight events.  Patient remains comatose this morning.  Objective: Current vital signs: BP 135/73 (BP Location: Right Arm)   Pulse 69   Temp 99.1 F (37.3 C) (Oral)   Resp 12   Wt 100.1 kg   SpO2 93%   BMI 29.93 kg/m  Vital signs in last 24 hours: Temp:  [98.3 F (36.8 C)-99.1 F (37.3 C)] 99.1 F (37.3 C) (02/24 0700) Pulse Rate:  [68-75] 69 (02/24 0825) Resp:  [12-14] 12 (02/24 0825) BP: (119-139)/(62-76) 135/73 (02/24 0800) SpO2:  [92 %-97 %] 93 % (02/24 0825) FiO2 (%):  [40 %-50 %] 50 % (02/24 0825) Weight:  [100.1 kg] 100.1 kg (02/24 0500)  Physical exam General: On ventilator, off Diprivan, Intubated, NAD HEENT-Fixed, nonreactive dilated pupil. Cardiovascular-regular rate and rhythm Lungs-no rhonchi or wheezing noted. Abdomen: no distention Extremities- Warm, dry and intact Musculoskeletal-no joint tenderness, deformity or swelling  Neurologic Exam: Mental status: Patient remains comatose, GCS 3, not breathing over the ventilator.  Unable to follow any commands.  No response to sternal rub. Cranial nerves: Dilated, non-reactive fixed pupil 5 mm, midline gaze no discontinuation noted.  No scleral, corneal or or oculocephalic reflex.  No cough or gag reflex. Motor: No spontaneous movements.  No response to noxious stimuli in upper extremities or right lower extremity.  Triple flexion noted in response to noxious stimuli on left lower extremity.  Toes downward bilaterally.  Lab Results: Basic Metabolic Panel: Recent Labs  Lab 01/27/2021 0556 01/25/2021 2304 01/20/2021 2316 01/12/2021 2325 01/19/21 0219 01/19/21 0426 01/19/21 0520 01/19/21 1115 01/19/21 1717 01/20/21 0610 01/21/21 0230  NA 141 141   < > 145 145 142 144  --   --  144 141  K 2.8* 3.5   < > 3.5 4.3 3.4* 3.3*  --   --  3.3* 4.3  CL 107 108  --  105  --  106  --   --   --  110 108  CO2 24 21*  --   --   --  25  --    --   --  25 22  GLUCOSE 94 228*  --  222*  --  112*  --   --   --  116* 104*  BUN 7 8  --  8  --  9  --   --   --  7 13  CREATININE 0.91 1.12  --  1.00  --  0.91  --   --   --  0.68 0.56*  CALCIUM 8.1* 7.8*  --   --   --  7.8*  --   --   --  8.1* 8.2*  MG 1.5*  --   --   --   --  1.5*  --  1.5* 1.5* 2.0 1.8  PHOS 2.4*  --   --   --   --  2.4*  --  2.7 2.9 2.0* 3.1   < > = values in this interval not displayed.   CBC: Recent Labs  Lab 01/17/21 2349 01/25/2021 0415 01/11/2021 0556 01/13/2021 2304 01/27/2021 2316 01/16/2021 2325 01/19/21 0219 01/19/21 0426 01/19/21 0520 01/20/21 0610  WBC 6.3  --  5.7 12.1*  --   --   --  12.0*  --  7.7  NEUTROABS 3.7  --   --  10.4*  --   --   --   --   --   --   HGB 11.6*   < >  11.4* 13.5   < > 13.3 8.2* 14.2 12.9* 12.1*  HCT 32.6*   < > 31.8* 41.6   < > 39.0 24.0* 42.3 38.0* 34.6*  MCV 80.9  --  81.1 86.0  --   --   --  83.4  --  81.6  PLT 160  --  168 146*  --   --   --  190  --  164   < > = values in this interval not displayed.   Medications:  Meds ordered this encounter  Medications  . sodium chloride 0.9 % bolus 1,000 mL  . etomidate (AMIDATE) injection  . rocuronium (ZEMURON) injection  . propofol (DIPRIVAN) 1000 MG/100ML infusion  . DISCONTD: norepinephrine (LEVOPHED) 73m in 2586mpremix infusion  . aspirin suppository 300 mg  . heparin injection 5,000 Units  . 0.9 %  sodium chloride infusion  . DISCONTD: pantoprazole (PROTONIX) injection 40 mg  . Chlorhexidine Gluconate Cloth 2 % PADS 6 each  . DISCONTD: chlorhexidine gluconate (MEDLINE KIT) (PERIDEX) 0.12 % solution 15 mL  . DISCONTD: MEDLINE mouth rinse  . chlorhexidine gluconate (MEDLINE KIT) (PERIDEX) 0.12 % solution 15 mL  . MEDLINE mouth rinse  . feeding supplement (VITAL HIGH PROTEIN) liquid 1,000 mL  . DISCONTD: feeding supplement (PROSource TF) liquid 45 mL  . pantoprazole sodium (PROTONIX) 40 mg/20 mL oral suspension 40 mg  . magnesium sulfate IVPB 4 g 100 mL  . potassium  chloride (KLOR-CON) packet 20 mEq  . DISCONTD: potassium chloride 10 mEq in 50 mL *CENTRAL LINE* IVPB  . potassium chloride 10 mEq in 100 mL IVPB  . levETIRAcetam (KEPPRA) IVPB 1500 mg/ 100 mL premix  . levETIRAcetam (KEPPRA) IVPB 1000 mg/100 mL premix  . feeding supplement (PROSource TF) liquid 45 mL  . feeding supplement (PIVOT 1.5 CAL) liquid 1,000 mL  . potassium PHOSPHATE 15 mmol in dextrose 5 % 250 mL infusion  . potassium chloride (KLOR-CON) packet 20 mEq  . DISCONTD: potassium chloride 10 mEq in 50 mL *CENTRAL LINE* IVPB  . potassium chloride 10 mEq in 100 mL IVPB  . artificial tears (LACRILUBE) ophthalmic ointment  . polyvinyl alcohol (LIQUIFILM TEARS) 1.4 % ophthalmic solution 1 drop  . midazolam (VERSED) injection 4 mg  . 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  Assessment: [age]ear old admitted for acute encephalopathy and with a third time fentanyl overdose.  Required CPR from PEA with ROSC after 15 minutes. Current presentation due to anoxic brain injury. -On examination today patient remains comatose, breathing with ventilator.  Unable to follow any commands and no response to sternal rub.  Pupils are dilated~51m53mfixed, nonreactive and no corneal reflex.  Triple flexion noted on left lower extremity and both toes going downwards. -MRI brain shows severe 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 -Family meeting with family yesterday and explained the assessment with poor prognosis from neurological point of view.  Recommendations: -We will likely progress to brain death. -Neurology will be available for any questions or another family meeting.   AnjHonor JunesD PGY-1, Resident  01/24/2021, 10:47 AM   Attending addendum Patient seen and examined this morning. Currently on the  ventilator, intubated, no sedation. No spontaneous movements Both pupils are 5 mm, fixed and dilated. Oculocephalic reflexes are absent He has no corneal reflexes-the sluggish corneal reflex that he  had on the left yesterday could not be elicited today. He is breathing with the ventilator and it is questionable whether he has any spontaneous breaths-he did show 1 breath over the vent on the monitor when being sternal rub but that might be artifactual. No limb movement in the upper extremities to noxious stimulation To noxious stimulation on the left lower extremity, he exhibited very subtle flexion of the ankle. Toes are downgoing bilaterally.  Assessment and recommendations: Potentially devastating anoxic brain injury with extremely poor chances from a neurological meaningful recovery standpoint. I think he will, if has not already progressed to brain death. I think an apnea test should be performed to confirm brain death. I will be available family meeting if needed. Discussed with Dr. Ruthann Cancer.  -- Amie Portland, MD Neurologist [facility] Pager: [phone]  CRITICAL CARE ATTESTATION Performed by: Amie Portland, MD Total critical care time: 33 minutes Critical care time was exclusive of separately billable procedures and treating other patients and/or supervising APPs/Residents/Students Critical care was necessary to treat or prevent imminent or life-threatening deterioration due to anoxic brain injury This patient is critically ill and at significant risk for neurological worsening and/or death and care requires constant monitoring. Critical care was time spent personally by me on the following activities: development of treatment plan with patient and/or surrogate as well as nursing, discussions with consultants, evaluation of patient's response to treatment, examination of patient, obtaining history from patient or surrogate, ordering and performing treatments and  interventions, ordering and review of laboratory studies, ordering and review of radiographic studies, pulse oximetry, re-evaluation of patient's condition, participation in multidisciplinary rounds and medical decision making of high complexity in the care of this patient.

## 2021-01-24 NOTE — Progress Notes (Signed)
RT found patients Vt on ventilator set at .  There is no documentation as to who changed vent setting.  Patients 8cc's is .  Per original order set tidal volume at 8 cc's.  RT changed from back to per original order.  RT will get a follow up ABG.

## 2021-01-24 NOTE — Progress Notes (Addendum)
NAME:  James Li, MRN:  [id number], DOB:  03/30/1991, LOS: 5 ADMISSION DATE:  01/12/2021,  REFERRING MD:  EDP, CHIEF COMPLAINT:  Acute Respiratory Distress, cardiac arrest   Brief History:  30 yo male with repeated episodes of drug overdose leading to VDRF.  This time he required CPR from PEA with ROSC after 15 minutes. Third overdose with fentanyl in a relatively short period of time.  History of Present Illness:  30 yo with h/o homelessness, drug abuse presented repeatedly for opioid overdose.   Past Medical History:  Polysubstance abuse  Significant Hospital Events:  2/17 Admitted 2/18 sign out AMA, brought back to ER with PEA cardiac arrest, start normothermia 2/19 developed myoclonic seizures 2/22 Normothermia, off sedation 2/23 Family meeting around prognosis  Consults:  Neurology  Procedures:  ETT 2/17 >> 2/18 ETT 2/18 >>   Significant Diagnostic Tests:   EEG 2/17 >> generalized slowing  CT head 2/17 >> no acute findings  CT head 2/19 >> no acute findings  EEG 2/19 >> generalized myoclonic seizures, periodic epileptiform discharges, background suppression and slowing  Echo 2/20 >> EF 60-65% normal LV, RV function, elevated RA pressure at 15  MRI 2/22 >> Severe, 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   Micro Data:  COVID PCR 2/13 >> Positive Blood 2/21>> No growth to date Blood 2/19 >> No growth to date Blood 2/17>> No growth to date  Antimicrobials:     Interim History / Subjective:  2/24: Patient unresponsive, neuro following. Eyes conjugate with fixed/dilaterd pupils this am. Increased vent settings. no temps overnight.   Family visited yesterday and met with CCM and neurology about goals of care/ prognosis. Desire to remain full code.  Pt does still initiate breaths when vent changed to cpap.    Objective   Blood pressure 130/69, pulse 69, temperature 99.1 F (37.3 C), temperature source Oral, resp. rate  12, weight 100.1 kg, SpO2 93 %.    Vent Mode: PRVC FiO2 (%):  [40 %-50 %] 50 % Set Rate:  [14 bmp] 14 bmp Vt Set:  [620 mL] 620 mL PEEP:  [5 cmH20] 5 cmH20 Plateau Pressure:  [18 cmH20] 18 cmH20   Intake/Output Summary (Last 24 hours) at 01/24/2021 0830 Last data filed at 01/24/2021 0600 Gross per 24 hour  Intake 1500 ml  Output 1165 ml  Net 335 ml   Filed Weights   01/22/21 0416 01/23/21 0426 01/24/21 0500  Weight: 100 kg 100 kg 100.1 kg    Examination: General: On vent, off diprivan, restful/ unresponsive, intubated HENT: Pupils without flicker response; both eyes midline, fixed pupils at 5-72m. Lungs: CTA bilaterally; no wheezes, rales Cardiovascular: RRR,no appreciable murmur;  Warm BUE and cap refill <3 seconds, BLE warm. Edema all extremities 2+. Abdomen: soft, non-tender; decreased bowel sounds;  Extremities: no cyanosis; no edema BUE/BLE Neuro: Not following commands; no gag reflex, no purposeful movements, no babinski bilaterally; no corneal reflexes bilaterally; left triple motor reflex present, absent on right;  GU: Foley Skin: bruising consistent with repeated neurological exams Psych: Unable to assess  Resolved Hospital Problem list     Assessment & Plan:  Acute metabolic/anoxic encephalopathy from cardiac arrest in setting of repeated accidental overdose with hx of substance abuse. 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 - normothermia >> protocol completion on 2/21 - diprivan gtt off - MRI 2/22 demonstrated severe, 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 - AEDs per neuro -continued conversations with family re: code status -  expectation for herniation is in next 24 hours with continued decline in neuro status.  -reassess resp drive this afternoon   PEA cardiac arrest from overdose. - Echo unremarkable. EF of 60-65%, no ventricle dysfunction, but elevated RA pressure at 15. Inferior vena cava dilated with <50% respiratory  variability -hemodynamically stable  Compromised airway in setting of overdose. Chest xray on 2/20 improving bilateral upper lobe opacities. - full vent support, on Fio2 to 50%   COVID 19 positive - from 01/13/21 - off airborne precautions with negative cxr and minimal vent settings 10 days since positive - no therapy at this time  Hypokalemia, hypophosphatemia, hypomagnesemia. - f/u BMET, Mg, Ph  Transaminitis Elevated liver enzymes on 2/18 (ASTof 73 and ALT of 91,T.bili of 2.1). Likely result of shock liver. -monitor   Best practice (evaluated daily)  Diet: Tube feed Pain/Anxiety/Delirium protocol (if indicated): NA VAP protocol (if indicated): VAP bundle DVT prophylaxis: SCDs GI prophylaxis: Protonix Mobility: Bed Disposition:ICU  Goals of Care:  Last date of multidisciplinary goals of care discussion: 2/23 with father, brothers and family Family and staff present: RN, neurology Follow up goals of care discussion HCW:CBJSEGBT change or 2/28 Code Status: FULL   Labs   CBC: Recent Labs  Lab 01/17/21 2349 01/11/2021 0415 01/22/2021 0556 01/14/2021 2304 01/25/2021 2316 01/05/2021 2325 01/19/21 0219 01/19/21 0426 01/19/21 0520 01/20/21 0610  WBC 6.3  --  5.7 12.1*  --   --   --  12.0*  --  7.7  NEUTROABS 3.7  --   --  10.4*  --   --   --   --   --   --   HGB 11.6*   < > 11.4* 13.5   < > 13.3 8.2* 14.2 12.9* 12.1*  HCT 32.6*   < > 31.8* 41.6   < > 39.0 24.0* 42.3 38.0* 34.6*  MCV 80.9  --  81.1 86.0  --   --   --  83.4  --  81.6  PLT 160  --  168 146*  --   --   --  190  --  164   < > = values in this interval not displayed.    Basic Metabolic Panel: Recent Labs  Lab 01/12/2021 0556 01/28/2021 2304 01/09/2021 2316 01/12/2021 2325 01/19/21 0219 01/19/21 0426 01/19/21 0520 01/19/21 1115 01/19/21 1717 01/20/21 0610 01/21/21 0230  NA 141 141   < > 145 145 142 144  --   --  144 141  K 2.8* 3.5   < > 3.5 4.3 3.4* 3.3*  --   --  3.3* 4.3  CL 107 108  --  105  --  106   --   --   --  110 108  CO2 24 21*  --   --   --  25  --   --   --  25 22  GLUCOSE 94 228*  --  222*  --  112*  --   --   --  116* 104*  BUN 7 8  --  8  --  9  --   --   --  7 13  CREATININE 0.91 1.12  --  1.00  --  0.91  --   --   --  0.68 0.56*  CALCIUM 8.1* 7.8*  --   --   --  7.8*  --   --   --  8.1* 8.2*  MG 1.5*  --   --   --   --  1.5*  --  1.5* 1.5* 2.0 1.8  PHOS 2.4*  --   --   --   --  2.4*  --  2.7 2.9 2.0* 3.1   < > = values in this interval not displayed.   GFR: Estimated Creatinine Clearance: 169.9 mL/min (A) (by C-G formula based on SCr of 0.56 mg/dL (L)). Recent Labs  Lab 01/17/2021 0556 01/03/2021 2304 01/19/21 0426 01/19/21 0428 01/20/21 0610  WBC 5.7 12.1* 12.0*  --  7.7  LATICACIDVEN  --  3.5*  --  1.8  --     Liver Function Tests: Recent Labs  Lab 01/17/21 1600 01/15/2021 0556 01/16/2021 2304  AST 83* 55* 73*  ALT 88* 79* 91*  ALKPHOS 58 51 79  BILITOT 1.8* 1.9* 2.1*  PROT 5.4* 5.1* 5.6*  ALBUMIN 3.4* 3.2* 3.4*   No results for input(s): LIPASE, AMYLASE in the last 168 hours. No results for input(s): AMMONIA in the last 168 hours.  ABG    Component Value Date/Time   PHART 7.433 01/19/2021 0520   PCO2ART 37.1 01/19/2021 0520   PO2ART 509 (H) 01/19/2021 0520   HCO3 25.0 01/19/2021 0520   TCO2 26 01/19/2021 0520   ACIDBASEDEF 1.0 01/19/2021 0219   O2SAT 100.0 01/19/2021 0520     Coagulation Profile: Recent Labs  Lab 01/17/21 1600 01/01/2021 2304 01/19/21 0426  INR 1.2 1.1 1.1    Cardiac Enzymes: No results for input(s): CKTOTAL, CKMB, CKMBINDEX, TROPONINI in the last 168 hours.  HbA1C: Hgb A1c MFr Bld  Date/Time Value Ref Range Status  01/13/2021 05:02 PM 4.5 (L) 4.8 - 5.6 % Final    Comment:    (NOTE) Pre diabetes:          5.7%-6.4%  Diabetes:              >6.4%  Glycemic control for   <7.0% adults with diabetes     CBG: Recent Labs  Lab 01/23/21 1156 01/23/21 1959 01/23/21 2350 01/24/21 0337 01/24/21 0808  GLUCAP 141*  159* 132* 160* 110*    Critical care time: The patient is critically ill with multiple organ systems failure and requires high complexity decision making for assessment and support, frequent evaluation and titration of therapies, application of advanced monitoring technologies and extensive interpretation of multiple databases.  Critical care time 33 mins. This represents my time independent of the NPs time taking care of the pt. This is excluding procedures.    Audria Nine DO [place] [facility] 01/24/2021, 2:20 PM See Amion for pager If no response to pager, please call [phone] until 1900 After 1900 please call [facility] [phone]

## 2021-01-24 NOTE — Progress Notes (Signed)
Nutrition Follow-up  DOCUMENTATION CODES:   Not applicable  INTERVENTION:   Tube Feeding via OG:  Change to Vital 1.5 at 65 ml/hr Pro-Source TF 45 mL 1ID Provides 2460 kcals, 138 g of protein and 1186 mL of free water Meets 100% estimated calorie and protein needs   NUTRITION DIAGNOSIS:   Inadequate oral intake related to inability to eat as evidenced by NPO status. Being addressed via TF  GOAL:   Provide needs based on ASPEN/SCCM guidelines  Met  MONITOR:   Vent status,TF tolerance,Labs,Weight trends  REASON FOR ASSESSMENT:   Ventilator,Consult Enteral/tube feeding initiation and management  ASSESSMENT:   30 year old male with no known medical history. He presented to the ED after OD on fentanyl which led to cardiac arrest. This is his 3rd episode of fentanyl OD in 1 week. He was discharged <12 hours prior to this event. He was found by his girlfriend unconscious and unresponsive next to the [place]. CPR x15 minutes, epi x1. He tested positive for COVID-19 on 01/13/21.  2/17 Admitted, Intubated 2/18 Extubated, left AMA, returned to ED with PEA cardiac arrest, TTM 2/19 Developed seizures 2/21 TTM complete 2/22 MRI brain with severe ischemic injury  Pt remains on vent support. [place] discussions ongoing, pt remains full code  Tolerating Pivot 1.5 at 50 ml/hr via OG  Weight stable since admission  Labs: reviewed Meds: NS at 50 ml/hr  Diet Order:   Diet Order    None      EDUCATION NEEDS:   Not appropriate for education at this time  Skin:  Skin Assessment: Reviewed RN Assessment  Last BM:  2/22  Height:   Ht Readings from Last 1 Encounters:  01/17/21 6' (1.829 m)    Weight:   Wt Readings from Last 1 Encounters:  01/24/21 100.1 kg    Ideal Body Weight:  80.91 kg  BMI:  Body mass index is 29.93 kg/m.  Estimated Nutritional Needs:   Kcal:  2400-2600 kcals  Protein:  125-150 g  Fluid:  >/= 2.5 L/day   Kerman Passey MS, RDN, LDN, CNSC Registered Dietitian III Clinical Nutrition RD Pager and On-Call Pager Number Located in [place]

## 2021-01-25 DIAGNOSIS — T50904A Poisoning by unspecified drugs, medicaments and biological substances, undetermined, initial encounter: Secondary | ICD-10-CM

## 2021-01-25 LAB — COMPREHENSIVE METABOLIC PANEL
ALT: 70 U/L — ABNORMAL HIGH (ref 0–44)
AST: 55 U/L — ABNORMAL HIGH (ref 15–41)
Albumin: 2.6 g/dL — ABNORMAL LOW (ref 3.5–5.0)
Alkaline Phosphatase: 61 U/L (ref 38–126)
Anion gap: 10 (ref 5–15)
BUN: 21 mg/dL — ABNORMAL HIGH (ref 6–20)
CO2: 28 mmol/L (ref 22–32)
Calcium: 8.4 mg/dL — ABNORMAL LOW (ref 8.9–10.3)
Chloride: 101 mmol/L (ref 98–111)
Creatinine, Ser: 0.5 mg/dL — ABNORMAL LOW (ref 0.61–1.24)
GFR, Estimated: >60 mL/min (ref >60)
Glucose, Bld: 148 mg/dL — ABNORMAL HIGH (ref 70–99)
Potassium: 4.8 mmol/L (ref 3.5–5.1)
Sodium: 139 mmol/L (ref 135–145)
Total Bilirubin: 0.7 mg/dL (ref 0.3–1.2)
Total Protein: 5.2 g/dL — ABNORMAL LOW (ref 6.5–8.1)

## 2021-01-25 LAB — GLUCOSE, CAPILLARY
Glucose-Capillary: 134 mg/dL — ABNORMAL HIGH (ref 70–99)
Glucose-Capillary: 146 mg/dL — ABNORMAL HIGH (ref 70–99)
Glucose-Capillary: 150 mg/dL — ABNORMAL HIGH (ref 70–99)
Glucose-Capillary: 160 mg/dL — ABNORMAL HIGH (ref 70–99)
Glucose-Capillary: 121 mg/dL — ABNORMAL HIGH (ref 70–99)
Glucose-Capillary: 127 mg/dL — ABNORMAL HIGH (ref 70–99)
Glucose-Capillary: 180 mg/dL — ABNORMAL HIGH (ref 70–99)

## 2021-01-25 NOTE — Progress Notes (Signed)
Goals of care discussion::  Date: 01/25/2021  Family member : Patient's father, two brothers, sister and his pastor.  From medical team patient's attending physician and his RN  Discussion: Explained poor prognosis considering patient had anoxic brain injury which is confirmed by brain MRI. His neurological exam is not consistent with brain dead as patient is able to trigger respiration on ventilator.  But he has lost all other brainstem reflexes and motor exam.  Options provided 1.  Continue aggressive care, until patient neuro exam progress/he goes into cardiac arrest  2.  Proceed with comfort care and palliative extubation  Patient's family would like some time to discuss, what time is best for palliative extubation and comfort care.  In the meantime they are all in agreement that he should be kept DNR.  DNR orders are written.    Cheri Fowler MD [place] [facility] See Amion for pager If no response to pager, please call [phone] until 7pm After 7pm, Please call E-link [phone]

## 2021-01-25 NOTE — Progress Notes (Addendum)
NAME:  James Li, MRN:  [id number], DOB:  [birth year], LOS: 6 ADMISSION DATE:  01/20/2021,  REFERRING MD:  EDP, CHIEF COMPLAINT:  Acute Respiratory Distress, cardiac arrest   Brief History:  30 yo male with repeated episodes of drug overdose leading to VDRF.  This time he required CPR from PEA with ROSC after 15 minutes. Third overdose with fentanyl in a relatively short period of time.  History of Present Illness:  30 yo with h/o homelessness, drug abuse presented repeatedly for opioid overdose.   Past Medical History:  Polysubstance abuse  Significant Hospital Events:  2/17 Admitted 2/18 sign out AMA, brought back to ER with PEA cardiac arrest, start normothermia 2/19 developed myoclonic seizures 2/22 Normothermia, off sedation 2/23 Family meeting around prognosis  Consults:  Neurology  Procedures:  ETT 2/17 >> 2/18 ETT 2/18 >>   Significant Diagnostic Tests:   EEG 2/17 >> generalized slowing  CT head 2/17 >> no acute findings  CT head 2/19 >> no acute findings  EEG 2/19 >> generalized myoclonic seizures, periodic epileptiform discharges, background suppression and slowing  Echo 2/20 >> EF 60-65% normal LV, RV function, elevated RA pressure at 15  MRI 2/22 >> Severe, 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   Micro Data:  COVID PCR 2/13 >> Positive Blood 2/21>> No growth to date Blood 2/19 >> No growth to date Blood 2/17>> No growth to date  Antimicrobials:     Interim History / Subjective:  2/25: Patient unresponsive, neuro following. Eyes conjugate with fixed/dilated pupils this am, R>L. Increased vent settings.  Patient did initiate breathing when vent changed to CPAP yesterday.  Full code at this time. Patient does not meet the criteria for brain death at this time.  Family understanding of patient's prognosis is essential at this point.   Objective   Blood pressure 137/74, pulse 73, temperature 98.4 F (36.9 C),  temperature source Oral, resp. rate (!) 8, weight 98.6 kg, SpO2 92 %.    Vent Mode: PRVC FiO2 (%):  [50 %-60 %] 60 % Set Rate:  [12 bmp-15 bmp] 15 bmp Vt Set:  [620 mL-870 mL] 620 mL PEEP:  [5 cmH20] 5 cmH20 Plateau Pressure:  [17 cmH20-24 cmH20] 21 cmH20   Intake/Output Summary (Last 24 hours) at 01/25/2021 0836 Last data filed at 01/25/2021 0600 Gross per 24 hour  Intake 1109.75 ml  Output 1225 ml  Net -115.25 ml   Filed Weights   01/23/21 0426 01/24/21 0500 01/25/21 0500  Weight: 100 kg 100.1 kg 98.6 kg    Examination: General: On vent, off diprivan, comatose, intubated HENT: Pupils without flicker response; both eyes midline, fixed pupils at 5-47mm R and 3-73mm L.  Lungs: CTA bilaterally, diminshed breath sounds RLL; no wheezes, rales Cardiovascular: RRR,no appreciable murmur;  Warm BUE and cap refill <3 seconds, BLE warm. Edema all extremities 2+. Abdomen: soft, non-tender; decreased bowel sounds;  Extremities: no cyanosis; no edema BUE/BLE Neuro: Not following commands; no cough reflex, no purposeful movements, no babinski bilaterally; no corneal reflexes. CN II-XII non-responsive; no triple motor reflex present right or left. LE Clonus present bilaterally per MD. Exam is slightly worse than yesterday. With vent pause, breathing on own per MD. GU: Foley, concentrated urine in bag Skin: bruising consistent with repeated neurological exams Psych: Unable to assess  Resolved Hospital Problem list     Assessment & Plan:  Acute metabolic/anoxic encephalopathy from cardiac arrest in setting of repeated accidental overdose with hx of substance  abuse. 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 - normothermia >> protocol completion on 2/21 - diprivan gtt off - MRI 2/22 demonstrated severe, 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 - AEDs per neuro -continued conversations with family re: code status -expectation for herniation is in next 24 hours with  continued decline in neuro status.  -apnea test today at 1100.   PEA cardiac arrest from overdose. - Echo unremarkable. EF of 60-65%, no ventricle dysfunction, but elevated RA pressure at 15. Inferior vena cava dilated with <50% respiratory variability -hemodynamically stable  Compromised airway in setting of overdose. Chest xray on 2/20 improving bilateral upper lobe opacities. - full vent support, on Fio2 to 50%   COVID 19 positive - from 01/13/21 - off airborne precautions with negative cxr and minimal vent settings 10 days since positive - no therapy at this time  Hypokalemia, hypophosphatemia, hypomagnesemia. - f/u BMET, Mg, Ph  Hepatitis Like due to shock liver. Resolving. Elevated liver enzymes on 2/18 (ASTof 73 and ALT of 91,T.bili of 2.1). -monitor   Best practice (evaluated daily)  Diet: Tube feed Pain/Anxiety/Delirium protocol (if indicated): NA VAP protocol (if indicated): VAP bundle DVT prophylaxis: SCDs GI prophylaxis: Protonix Mobility: Bed Disposition:ICU  Goals of Care:  Last date of multidisciplinary goals of care discussion: 2/23 with father, brothers and family Family and staff present: RN, neurology Follow up goals of care discussion IDP:[id number] change or 2/28 Code Status: FULL   Labs   CBC: Recent Labs  Lab 2021/02/16 2304 02/16/2021 2316 01/19/21 0219 01/19/21 0426 01/19/21 0520 01/20/21 0610 01/24/21 2113  WBC 12.1*  --   --  12.0*  --  7.7  --   NEUTROABS 10.4*  --   --   --   --   --   --   HGB 13.5   < > 8.2* 14.2 12.9* 12.1* 11.2*  HCT 41.6   < > 24.0* 42.3 38.0* 34.6* 33.0*  MCV 86.0  --   --  83.4  --  81.6  --   PLT 146*  --   --  190  --  164  --    < > = values in this interval not displayed.    Basic Metabolic Panel: Recent Labs  Lab 2021-02-16 2304 02/16/21 2316 16-Feb-2021 2325 01/19/21 0219 01/19/21 0426 01/19/21 0520 01/19/21 1115 01/19/21 1717 01/20/21 0610 01/21/21 0230 01/24/21 2113 01/25/21 0158  NA 141    < > 145   < > 142 144  --   --  144 141 140 139  K 3.5   < > 3.5   < > 3.4* 3.3*  --   --  3.3* 4.3 3.9 4.8  CL 108  --  105  --  106  --   --   --  110 108  --  101  CO2 21*  --   --   --  25  --   --   --  25 22  --  28  GLUCOSE 228*  --  222*  --  112*  --   --   --  116* 104*  --  148*  BUN 8  --  8  --  9  --   --   --  7 13  --  21*  CREATININE 1.12  --  1.00  --  0.91  --   --   --  0.68 0.56*  --  0.50*  CALCIUM 7.8*  --   --   --  7.8*  --   --   --  8.1* 8.2*  --  8.4*  MG  --   --   --   --  1.5*  --  1.5* 1.5* 2.0 1.8  --   --   PHOS  --   --   --   --  2.4*  --  2.7 2.9 2.0* 3.1  --   --    < > = values in this interval not displayed.   GFR: Estimated Creatinine Clearance: 168.7 mL/min (A) (by C-G formula based on SCr of 0.5 mg/dL (L)). Recent Labs  Lab 01/14/2021 2304 01/19/21 0426 01/19/21 0428 01/20/21 0610  WBC 12.1* 12.0*  --  7.7  LATICACIDVEN 3.5*  --  1.8  --     Liver Function Tests: Recent Labs  Lab January 26, 2021 2304 01/25/21 0158  AST 73* 55*  ALT 91* 70*  ALKPHOS 79 61  BILITOT 2.1* 0.7  PROT 5.6* 5.2*  ALBUMIN 3.4* 2.6*   No results for input(s): LIPASE, AMYLASE in the last 168 hours. No results for input(s): AMMONIA in the last 168 hours.  ABG    Component Value Date/Time   PHART 7.350 01/24/2021 2113   PCO2ART 59.6 (H) 01/24/2021 2113   PO2ART 85 01/24/2021 2113   HCO3 32.9 (H) 01/24/2021 2113   TCO2 35 (H) 01/24/2021 2113   ACIDBASEDEF 1.0 01/19/2021 0219   O2SAT 95.0 01/24/2021 2113     Coagulation Profile: Recent Labs  Lab Jan 26, 2021 2304 01/19/21 0426  INR 1.1 1.1   No results for input(s): CKTOTAL, CKMB, CKMBINDEX, TROPONINI in the last 168 hours.  HbA1C: Hgb A1c MFr Bld  Date/Time Value Ref Range Status  01/13/2021 05:02 PM 4.5 (L) 4.8 - 5.6 % Final    Comment:    (NOTE) Pre diabetes:          5.7%-6.4%  Diabetes:              >6.4%  Glycemic control for   <7.0% adults with diabetes     CBG: Recent Labs  Lab  01/24/21 1548 01/24/21 1931 01/24/21 2336 01/25/21 0325 01/25/21 0751  GLUCAP 152* 149* 149* 134* 146*

## 2021-01-26 LAB — COMPREHENSIVE METABOLIC PANEL
ALT: 87 U/L — ABNORMAL HIGH (ref 0–44)
AST: 54 U/L — ABNORMAL HIGH (ref 15–41)
Albumin: 2.4 g/dL — ABNORMAL LOW (ref 3.5–5.0)
Alkaline Phosphatase: 73 U/L (ref 38–126)
Anion gap: 9 (ref 5–15)
BUN: 21 mg/dL — ABNORMAL HIGH (ref 6–20)
CO2: 29 mmol/L (ref 22–32)
Calcium: 8.4 mg/dL — ABNORMAL LOW (ref 8.9–10.3)
Chloride: 102 mmol/L (ref 98–111)
Creatinine, Ser: 0.51 mg/dL — ABNORMAL LOW (ref 0.61–1.24)
GFR, Estimated: >60 mL/min (ref >60)
Glucose, Bld: 144 mg/dL — ABNORMAL HIGH (ref 70–99)
Potassium: 4.5 mmol/L (ref 3.5–5.1)
Sodium: 140 mmol/L (ref 135–145)
Total Bilirubin: 0.7 mg/dL (ref 0.3–1.2)
Total Protein: 5.3 g/dL — ABNORMAL LOW (ref 6.5–8.1)

## 2021-01-26 LAB — GLUCOSE, CAPILLARY
Glucose-Capillary: 139 mg/dL — ABNORMAL HIGH (ref 70–99)
Glucose-Capillary: 154 mg/dL — ABNORMAL HIGH (ref 70–99)
Glucose-Capillary: 110 mg/dL — ABNORMAL HIGH (ref 70–99)

## 2021-01-26 LAB — CULTURE, BLOOD (ROUTINE X 2)
Culture: NO GROWTH
Culture: NO GROWTH

## 2021-01-26 MED ORDER — LORAZEPAM 2 MG/ML IJ SOLN
2.0000 mg | INTRAMUSCULAR | Status: DC | PRN
  Administered 2021-01-26: 2 mg via INTRAVENOUS
  Filled 2021-01-26: qty 1

## 2021-01-26 MED ORDER — GLYCOPYRROLATE 0.2 MG/ML IJ SOLN
0.1000 mg | Freq: Once | INTRAMUSCULAR | Status: AC
  Administered 2021-01-26: 0.1 mg via INTRAVENOUS
  Filled 2021-01-26: qty 1

## 2021-01-26 MED ORDER — MORPHINE SULFATE (PF) 2 MG/ML IV SOLN
2.0000 mg | INTRAVENOUS | Status: DC | PRN
  Administered 2021-01-26: 2 mg via INTRAVENOUS
  Filled 2021-01-26 (×3): qty 1

## 2021-01-29 NOTE — Progress Notes (Addendum)
   2021-02-18 1405  Clinical Encounter Type  Visited With Family  Visit Type Patient actively dying  Referral From Nurse  Consult/Referral To Chaplain  Chaplain responded to page. The chaplain met the patient's father, Jonell Krontz, and other family members. The family's pastor, introduced himself, Baltazar Najjar. The pastor spoke for the family and shared strong faith. The chaplain offered the ministry of presence, and refreshments to bring to [facility]. . This note was prepared by Jeanine Luz, M.Div..  For questions please contact by phone [phone].

## 2021-01-29 NOTE — Progress Notes (Addendum)
Patient extubated at 1400 and expired at 1414, Patient ME case. Patient transfer to the morgue  With nothing removed. ME made aware.

## 2021-01-29 NOTE — Death Summary Note (Signed)
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 year old male who was brought into the emergency department by EMS after patient had overdosed on fentanyl leading to cardiac arrest.  This is a third episode of fentanyl overdose this week.  He had been intubated on the prior to events.  Patient was discharged less than 12 hours prior to his cardiac arrest event.  Patient was found in the [place] next to the [place] that he was living in.  His girlfriend apparently called EMS when he became unconscious unresponsive.  On arrival of EMS he was found to be in cardiac arrest and CPR was performed for approximately 15 minutes.  1 round of epinephrine was used.  The rhythm was PEA.  Patient was admitted to ICU, he was continued with mechanical ventilatory support, MRI brain was done which was consistent with extensive anoxic brain injury.  Patient's physical exam showed intact respiratory drive but the rest of the cranial nerve reflexes were absent.  Goals of care discussion was  held with family, they made him DNR on 2/25 and decided to proceed with palliative extubation considering poor prognosis on 2023-01-27.  Patient was declared dead on 2021/01/27 at 2:14 PM    Pertinent Labs and Studies  Significant Diagnostic Studies DG Abd 1 View  Result Date: 01/17/2021 CLINICAL DATA:  NG tube placement EXAM: ABDOMEN - 1 VIEW COMPARISON:  None. FINDINGS: Esophageal tube tip overlies the first portion of duodenum. Nonspecific mild increased bowel gas without obstructive pattern IMPRESSION: Esophageal tube tip overlies the first portion of duodenum. Electronically Signed   By: Jasmine Pang M.D.   On: 01/17/2021 21:45   CT Head Wo Contrast  Result Date: 01/19/2021 CLINICAL DATA:  Mental status change, unknown cause EXAM: CT HEAD WITHOUT CONTRAST TECHNIQUE: Contiguous axial images were obtained from the base of the skull through the vertex without intravenous contrast. COMPARISON:  CT 01/17/2021 FINDINGS: Brain: No evidence of acute infarction, hemorrhage, hydrocephalus, extra-axial collection, visible mass lesion or mass effect. Vascular: No hyperdense vessel or unexpected calcification. Skull: No calvarial fracture or suspicious osseous lesion. No scalp swelling or hematoma. Sinuses/Orbits: Minimal thickening in the paranasal sinuses. Additional secretions noted in the posterior nasopharynx. Coiling of the transesophageal tube in the posterior oropharynx noted incidentally. Mastoid air cells are predominantly clear. Included orbital structures are unremarkable. Other: None IMPRESSION: 1. No acute intracranial abnormality. 2. Coiling of the transesophageal tube in the posterior oropharynx noted incidentally with mural thickening in the posterior paranasal sinuses and secretions in the nasopharynx likely related to this instrumentation. Consider repositioning. Electronically Signed   By: Kreg Shropshire M.D.   On: 01/19/2021 02:00   CT Head Wo  Contrast  Result Date: 01/17/2021 CLINICAL DATA:   Altered mental status EXAM: CT HEAD WITHOUT CONTRAST TECHNIQUE: Contiguous axial images were obtained from the base of the skull through the vertex without intravenous contrast. COMPARISON:  01/13/2021 FINDINGS: Brain: No acute intracranial abnormality. Specifically, no hemorrhage, hydrocephalus, mass lesion, acute infarction, or significant intracranial injury. Vascular: No hyperdense vessel or unexpected calcification. Skull: No acute calvarial abnormality. Sinuses/Orbits: No acute findings. NG tube and endotracheal tube in place. Other: None IMPRESSION: No acute intracranial abnormality. Electronically Signed   By: Charlett NoseKevin  Dover M.D.   On: 01/17/2021 16:59   CT Head Wo Contrast  Result Date: 01/13/2021 CLINICAL DATA:  Seizure EXAM: CT HEAD WITHOUT CONTRAST TECHNIQUE: Contiguous axial images were obtained from the base of the skull through the vertex without intravenous contrast. COMPARISON:  None. FINDINGS: Brain: Ventricles and sulci are normal in size and configuration. There is no intracranial mass, hemorrhage, extra-axial fluid collection, or midline shift. The brain parenchyma appears unremarkable. There is no evident acute infarct. Vascular: There is no hyperdense vessel. No evident vascular calcification. Skull: Bony calvarium appears intact. Sinuses/Orbits: Mucosal thickening noted in several ethmoid air cells. Orbits appear symmetric bilaterally. Other: Mastoid air cells are clear. IMPRESSION: Brain parenchyma appears unremarkable. No mass or hemorrhage. There is mucosal thickening in several ethmoid air cells. Electronically Signed   By: Bretta BangWilliam  Woodruff III M.D.   On: 01/13/2021 15:37   CT Cervical Spine Wo Contrast  Result Date: 01/17/2021 CLINICAL DATA:  Drug overdose, seizure EXAM: CT CERVICAL SPINE WITHOUT CONTRAST TECHNIQUE: Multidetector CT imaging of the cervical spine was performed without intravenous contrast. Multiplanar CT image reconstructions were also generated. COMPARISON:   08/08/2019 FINDINGS: Alignment: Alignment is anatomic. Skull base and vertebrae: No acute fracture. No primary bone lesion or focal pathologic process. Soft tissues and spinal canal: No prevertebral fluid or swelling. No visible canal hematoma. Endotracheal tube and enteric catheter are identified. Disc levels:  No significant spondylosis or facet hypertrophy. Upper chest: Endotracheal tube is identified, terminating at level of thoracic inlet. Hypoventilatory changes are seen within the dependent upper lobes. Other: Reconstructed images demonstrate no additional findings. IMPRESSION: 1. No acute cervical spine fracture. Electronically Signed   By: Sharlet SalinaMichael  Brown M.D.   On: 01/17/2021 17:00   MR BRAIN WO CONTRAST  Result Date: 01/22/2021 CLINICAL DATA:  Anoxic brain injury EXAM: MRI HEAD WITHOUT CONTRAST TECHNIQUE: Multiplanar, multiecho pulse sequences of the brain and surrounding structures were obtained without intravenous contrast. COMPARISON:  None. FINDINGS: Brain: There is diffuse cortical and deep gray matter abnormal diffusion restriction with sparing of the cerebellum. There is moderate cortical edema. Mild mass effect on the brainstem. Vascular: Major flow voids are preserved. Skull and upper cervical spine: Normal calvarium and skull base. Visualized upper cervical spine and soft tissues are normal. Sinuses/Orbits:Bilateral mastoid fluid.  Normal orbits. IMPRESSION: Severe, 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 Electronically Signed   By: Deatra RobinsonKevin  Herman M.D.   On: 01/22/2021 19:16   DG Chest Port 1 View  Result Date: 01/20/2021 CLINICAL DATA:  Respiratory failure.  COVID. EXAM: PORTABLE CHEST 1 VIEW COMPARISON:  01/06/2021 FINDINGS: ETT tip is stable above the carina. NG tube tip is below the GE junction. Stable cardiomediastinal contours. Bilateral upper lobe hazy lung opacities are improved from previous exam. No new findings. IMPRESSION: Improving bilateral upper lobe  airspace opacities. Electronically Signed   By: Signa Kellaylor  Stroud M.D.   On: 01/20/2021 09:32   DG Chest Portable 1 View  Result Date: 01/13/2021 CLINICAL DATA:  Post resuscitation.  Ventilator support. EXAM: PORTABLE CHEST 1 VIEW COMPARISON:  01/17/2021 FINDINGS: Endotracheal tube tip 5 cm above the carina. Orogastric or nasogastric tube tip just within the stomach. Side hole is at the distal thoracic esophagus level. Worsened perihilar edema pattern. IMPRESSION: 1. Endotracheal tube tip 5 cm above the carina. 2. Orogastric or nasogastric tube tip just within the stomach. Side hole at the distal thoracic esophagus level. 3. Worsened perihilar edema pattern. Electronically Signed   By: Paulina Fusi M.D.   On: 01/01/2021 23:14   DG Chest Portable 1 View  Result Date: 01/17/2021 CLINICAL DATA:  Intubated EXAM: PORTABLE CHEST 1 VIEW COMPARISON:  01/16/2021 FINDINGS: Single frontal view of the chest demonstrates endotracheal tube overlying tracheal air column, tip midway between thoracic inlet and carina. Enteric catheter passes below diaphragm tip excluded by collimation. Cardiac silhouette is enlarged but stable. Lung volumes are diminished. Increased central vascular congestion, with mild bilateral left greater than right perihilar ground-glass airspace disease. No effusion or pneumothorax. IMPRESSION: 1. No complication after intubation. 2. Decreased lung volumes, with increasing central vascular congestion and developing perihilar airspace disease. Favor edema over infection. Electronically Signed   By: Sharlet Salina M.D.   On: 01/17/2021 15:45   DG Chest Port 1 View  Result Date: 01/16/2021 CLINICAL DATA:  Shortness of breath.  COVID. EXAM: PORTABLE CHEST 1 VIEW COMPARISON:  01/04/2017. FINDINGS: Mediastinum and hilar structures normal. Heart size stable. Mild peribronchial cuffing. Bronchitis cannot be excluded. No focal infiltrate. No pleural effusion or pneumothorax. Mild thoracic spine scoliosis. No  acute bony abnormality. IMPRESSION: Mild peribronchial cuffing. Bronchitis cannot be excluded. Electronically Signed   By: Maisie Fus  Register   On: 01/16/2021 08:15   DG Chest Portable 1 View  Result Date: 01/13/2021 CLINICAL DATA:  Status post intubation. EXAM: PORTABLE CHEST 1 VIEW COMPARISON:  Chest x-ray dated January 04, 2017. FINDINGS: Endotracheal tube tip 4.7 cm above the carina. Enteric tube in the stomach with the tip in the gastric fundus. The heart size and mediastinal contours are within normal limits. Normal pulmonary vascularity. No focal consolidation, pleural effusion, or pneumothorax. No acute osseous abnormality. IMPRESSION: 1. Appropriately positioned endotracheal and enteric tubes. 2. No active disease. Electronically Signed   By: Obie Dredge M.D.   On: 01/13/2021 14:36   EEG adult  Result Date: 01/19/2021 Charlsie Quest, MD     01/19/2021  1:29 PM Patient Name: Samuele Storey MRN: [id number] Epilepsy Attending: Charlsie Quest Referring Physician/Provider: Dr Glyn Ade Date:  01/19/2021 Duration: 22.22 mins Patient history: 29yo M s/p cardiac arrest. EEG to evaluate for seizure Level of alertness:  Comatose AEDs during EEG study: Propofol Technical aspects: This EEG study was done with scalp electrodes positioned according to the 10-20 International system of electrode placement. Electrical activity was acquired at a sampling rate of 500Hz  and reviewed with a high frequency filter of 70Hz  and a low frequency filter of 1Hz . EEG data were recorded continuously and digitally stored. Description:  EEG showed continuous generalized background suppression. Generalized periodic epileptiform discharges were noted at 1hz . Patient was also noted to have generalized axial twitching with eye opening. Concomitant eeg showed generalized polyspikes consistent with myoclonic seizures. Hyperventilation and photic stimulation were not performed.   ABNORMALITY - Myoclonic seizures,  generalized - Periodic epileptiform discharges, generalized - Background suppression, generalized IMPRESSION: This study showed myoclonic seizures as well as profound diffuse encephalopathy. In the setting of cardiac arrest, this is most  likely suggestive of anoxic-hypoxic brain injury, Dr Craige Cotta was notified Charlsie Quest   EEG adult  Result Date: 01/17/2021 Charlsie Quest, MD     01/17/2021  6:24 PM Patient Name: Zavon Hyson MRN: [id number] Epilepsy Attending: Charlsie Quest Referring Physician/Provider: Lanae Boast, NP Date: 01/17/2021 Duration: 23.51 mins Patient history: 29yo M found down, noted to have seizure like activity. EEG to evaluate for seizure Level of alertness: comatose AEDs during EEG study: Propofol Technical aspects: This EEG study was done with scalp electrodes positioned according to the 10-20 International system of electrode placement. Electrical activity was acquired at a sampling rate of 500Hz  and reviewed with a high frequency filter of 70Hz  and a low frequency filter of 1Hz . EEG data were recorded continuously and digitally stored. Description: EEG showed continuous generalized low amplitude 2-3hz  delta slowing admixed with intermittent 5-6hz  theta slowing. At times 15-18Hz  beta activity was also noted in frontocentral region.  3 to 6 Hz theta-delta slowing. Hyperventilation and photic stimulation were not performed.   ABNORMALITY -Continuous slow, generalized IMPRESSION: This study is suggestive of severe diffuse encephalopathy, nonspecific etiology but could be related to sedation. No seizures or epileptiform discharges were seen throughout the recording. Priyanka   Overnight EEG with video  Result Date: 01/20/2021 , MD     01/21/2021 10:52 AM Patient Name: Klayton Monie MRN: Charlsie Quest Epilepsy Attending: 01/23/2021 Referring Physician/Provider: Dr Perrin Maltese Duration: 01/19/2021 1312 to 01/20/2021 1312   Patient history: 29yo M s/p cardiac arrest. EEG to evaluate for seizure  Level of alertness:  Comatose  AEDs during EEG study: Propofol, LEV  Technical aspects: This EEG study was done with scalp electrodes positioned according to the 10-20 International system of electrode placement. Electrical activity was acquired at a sampling rate of 500Hz  and reviewed with a high frequency filter of 70Hz  and a low frequency filter of 1Hz . EEG data were recorded continuously and digitally stored.  Description:  EEG initially showed continuous generalized background suppression. Generalized periodic epileptiform discharges were noted at 0.5-1hz . Patient was also noted to initially have generalized axial twitching with eye opening and eventually only opening.Concomitant eeg showed generalized polyspikes consistent with myoclonic seizures. After around 1900, eeg showed continuous generalized low amplitude 3-5hz  theta-delta slowing admixed with generalized spikes at times periodic at 1-Hz.     ABNORMALITY - Myoclonic seizures, generalized - Periodic epileptiform discharges, generalized - Background suppression, generalized - Continuous slow. Generalized  IMPRESSION: This study initially showed myoclonic seizures as well as profound diffuse encephalopathy. After around 1900 on 01/19/2021, no further seizures were noted and eeg showed generalized epileptogenicity as well as severe diffuse encephalopathy. EEG appears to be improving compared to yesterday.  01/21/2021   ECHOCARDIOGRAM COMPLETE  Result Date: 01/20/2021    ECHOCARDIOGRAM REPORT   Patient Name:   CHANEY MACLAREN [facility] Date of Exam: 01/20/2021 Medical Rec #:  01/21/2021                 Height:       72.0 in Accession #:    Charlsie Quest                Weight:       215.6 lb Date of Birth:  04/23/1991                 BSA:          2.200 m Patient Age:    46 years  BP:           129/75 mmHg Patient Gender: M                         HR:            85 bpm. Exam Location:  Inpatient Procedure: 2D Echo, Cardiac Doppler and Color Doppler Indications:    Cardiac arrest  History:        Patient has no prior history of Echocardiogram examinations.                 COVID-19.  Sonographer:    Ross Ludwig RDCS (AE) Referring Phys: [id number] VINEET SOOD  Sonographer Comments: Echo performed with patient supine and on artificial respirator. COVID-19 IMPRESSIONS  1. Left ventricular ejection fraction, by estimation, is 60 to 65%. The left ventricle has normal function. The left ventricle has no regional wall motion abnormalities. There is mild concentric left ventricular hypertrophy. Left ventricular diastolic parameters were normal.  2. On ventilator.. Right ventricular systolic function is normal. The right ventricular size is normal. There is moderately elevated pulmonary artery systolic pressure.  3. The mitral valve is normal in structure. Mild mitral valve regurgitation. No evidence of mitral stenosis.  4. The aortic valve is normal in structure. Aortic valve regurgitation is not visualized. No aortic stenosis is present.  5. The inferior vena cava is dilated in size with <50% respiratory variability, suggesting right atrial pressure of 15 mmHg. FINDINGS  Left Ventricle: Left ventricular ejection fraction, by estimation, is 60 to 65%. The left ventricle has normal function. The left ventricle has no regional wall motion abnormalities. The left ventricular internal cavity size was normal in size. There is  mild concentric left ventricular hypertrophy. Left ventricular diastolic parameters were normal. Right Ventricle: On ventilator. The right ventricular size is normal. No increase in right ventricular wall thickness. Right ventricular systolic function is normal. There is moderately elevated pulmonary artery systolic pressure. The tricuspid regurgitant velocity is 2.76 m/s, and with an assumed right atrial pressure of 15 mmHg, the estimated right ventricular systolic  pressure is 45.5 mmHg. Left Atrium: Left atrial size was normal in size. Right Atrium: Right atrial size was normal in size. Pericardium: There is no evidence of pericardial effusion. Mitral Valve: The mitral valve is normal in structure. Mild mitral valve regurgitation. No evidence of mitral valve stenosis. Tricuspid Valve: The tricuspid valve is normal in structure. Tricuspid valve regurgitation is mild . No evidence of tricuspid stenosis. Aortic Valve: The aortic valve is normal in structure. Aortic valve regurgitation is not visualized. No aortic stenosis is present. Aortic valve mean gradient measures 5.0 mmHg. Aortic valve peak gradient measures 8.1 mmHg. Aortic valve area, by VTI measures 4.00 cm. Pulmonic Valve: The pulmonic valve was normal in structure. Pulmonic valve regurgitation is not visualized. No evidence of pulmonic stenosis. Aorta: The aortic root is normal in size and structure. Venous: The inferior vena cava is dilated in size with less than 50% respiratory variability, suggesting right atrial pressure of 15 mmHg. IAS/Shunts: No atrial level shunt detected by color flow Doppler.  LEFT VENTRICLE PLAX 2D LVIDd:         5.40 cm  Diastology LVIDs:         3.40 cm  LV e' medial:    9.79 cm/s LV PW:         1.30 cm  LV E/e' medial:  10.4 LV IVS:  1.20 cm  LV e' lateral:   18.00 cm/s LVOT diam:     2.40 cm  LV E/e' lateral: 5.7 LV SV:         98 LV SV Index:   44 LVOT Area:     4.52 cm  RIGHT VENTRICLE             IVC RV Basal diam:  3.60 cm     IVC diam: 2.30 cm RV Mid diam:    2.70 cm RV S prime:     18.80 cm/s TAPSE (M-mode): 2.9 cm LEFT ATRIUM           Index       RIGHT ATRIUM           Index LA diam:      2.40 cm 1.09 cm/m  RA Area:     20.00 cm LA Vol (A2C): 49.6 ml 22.55 ml/m RA Volume:   56.40 ml  25.64 ml/m LA Vol (A4C): 42.0 ml 19.09 ml/m  AORTIC VALVE AV Area (Vmax):    4.27 cm AV Area (Vmean):   3.76 cm AV Area (VTI):     4.00 cm AV Vmax:           142.00 cm/s AV Vmean:           106.000 cm/s AV VTI:            0.244 m AV Peak Grad:      8.1 mmHg AV Mean Grad:      5.0 mmHg LVOT Vmax:         134.00 cm/s LVOT Vmean:        88.100 cm/s LVOT VTI:          0.216 m LVOT/AV VTI ratio: 0.89  AORTA Ao Root diam: 3.30 cm Ao Asc diam:  3.30 cm MITRAL VALVE                TRICUSPID VALVE MV Area (PHT): 5.02 cm     TR Peak grad:   30.5 mmHg MV Decel Time: 151 msec     TR Vmax:        276.00 cm/s MV E velocity: 102.00 cm/s MV A velocity: 59.40 cm/s   SHUNTS MV E/A ratio:  1.72         Systemic VTI:  0.22 m                             Systemic Diam: 2.40 cm Tobias Alexander MD Electronically signed by Tobias Alexander MD Signature Date/Time: 01/20/2021/2:34:12 PM    Final     Microbiology Recent Results (from the past 240 hour(s))  Culture, blood (Routine X 2) w Reflex to ID Panel     Status: None   Collection Time: 01/17/21 11:49 PM   Specimen: BLOOD RIGHT HAND  Result Value Ref Range Status   Specimen Description BLOOD RIGHT HAND  Final   Special Requests   Final    BOTTLES DRAWN AEROBIC ONLY Blood Culture adequate volume   Culture   Final    NO GROWTH 5 DAYS Performed at [facility], [address], [place], [place] [address]    Report Status 01/23/2021 FINAL  Final  Culture, blood (Routine X 2) w Reflex to ID Panel     Status: None   Collection Time: 01/17/21 11:49 PM   Specimen: BLOOD RIGHT ARM  Result Value Ref Range Status  Specimen Description BLOOD RIGHT ARM  Final   Special Requests   Final    BOTTLES DRAWN AEROBIC ONLY Blood Culture adequate volume   Culture   Final    NO GROWTH 5 DAYS Performed at [facility], [address], [place], [place] [address]    Report Status 01/23/2021 FINAL  Final  Culture, blood (routine x 2)     Status: None   Collection Time: 01/19/21  3:45 AM   Specimen: BLOOD  Result Value Ref Range Status   Specimen Description BLOOD LEFT ANTECUBITAL  Final   Special Requests AEROBIC BOTTLE ONLY Blood Culture adequate  volume  Final   Culture   Final    NO GROWTH 5 DAYS Performed at [facility], [address], [place], [place] [address]    Report Status 01/24/2021 FINAL  Final  Culture, blood (routine x 2)     Status: None   Collection Time: 01/19/21 11:25 AM   Specimen: BLOOD  Result Value Ref Range Status   Specimen Description BLOOD RIGHT ANTECUBITAL  Final   Special Requests   Final    AEROBIC BOTTLE ONLY Blood Culture results may not be optimal due to an inadequate volume of blood received in culture bottles   Culture   Final    NO GROWTH 5 DAYS Performed at [facility], [address], [place], [place] [address]    Report Status 01/24/2021 FINAL  Final  Culture, blood (Routine X 2) w Reflex to ID Panel     Status: None (Preliminary result)   Collection Time: 01/21/21 11:02 PM   Specimen: BLOOD  Result Value Ref Range Status   Specimen Description BLOOD SITE NOT SPECIFIED  Final   Special Requests   Final    BOTTLES DRAWN AEROBIC ONLY Blood Culture results may not be optimal due to an inadequate volume of blood received in culture bottles   Culture   Final    NO GROWTH 4 DAYS Performed at [facility], [address], [place], [place] [address]    Report Status PENDING  Incomplete  Culture, blood (Routine X 2) w Reflex to ID Panel     Status: None (Preliminary result)   Collection Time: 01/21/21 11:02 PM   Specimen: BLOOD  Result Value Ref Range Status   Specimen Description BLOOD SITE NOT SPECIFIED  Final   Special Requests   Final    BOTTLES DRAWN AEROBIC ONLY Blood Culture results may not be optimal due to an inadequate volume of blood received in culture bottles   Culture   Final    NO GROWTH 4 DAYS Performed at [facility], [address], [place], [place] [address]    Report Status PENDING  Incomplete    Lab Basic Metabolic Panel: Recent Labs  Lab 01/19/21 1717 01/20/21 0610 01/21/21 0230 01/24/21 2113 01/25/21 0158 02-20-2021 0140  NA  --   144 141 140 139 140  K  --  3.3* 4.3 3.9 4.8 4.5  CL  --  110 108  --  101 102  CO2  --  25 22  --  28 29  GLUCOSE  --  116* 104*  --  148* 144*  BUN  --  7 13  --  21* 21*  CREATININE  --  0.68 0.56*  --  0.50* 0.51*  CALCIUM  --  8.1* 8.2*  --  8.4* 8.4*  MG 1.5* 2.0 1.8  --   --   --  PHOS 2.9 2.0* 3.1  --   --   --    Liver Function Tests: Recent Labs  Lab 01/25/21 0158 2021/02/17 0140  AST 55* 54*  ALT 70* 87*  ALKPHOS 61 73  BILITOT 0.7 0.7  PROT 5.2* 5.3*  ALBUMIN 2.6* 2.4*   No results for input(s): LIPASE, AMYLASE in the last 168 hours. No results for input(s): AMMONIA in the last 168 hours. CBC: Recent Labs  Lab 01/20/21 0610 01/24/21 2113  WBC 7.7  --   HGB 12.1* 11.2*  HCT 34.6* 33.0*  MCV 81.6  --   PLT 164  --    Cardiac Enzymes: No results for input(s): CKTOTAL, CKMB, CKMBINDEX, TROPONINI in the last 168 hours. Sepsis Labs: Recent Labs  Lab 01/20/21 0610  WBC 7.7    Procedures/Operations     James Li 02-17-21, 2:41 PM

## 2021-01-29 DEATH — deceased

## 2021-02-06 ENCOUNTER — Telehealth: Payer: Self-pay | Admitting: Physician Assistant

## 2022-08-27 IMAGING — CT CT HEAD W/O CM
3 series · 14 of 47 positions shown, 16 images · non-contrast
Comparison: CT 01/17/2021

CLINICAL DATA: Mental status change, unknown cause

EXAM:
CT HEAD WITHOUT CONTRAST
TECHNIQUE: Contiguous axial images were obtained from the base of the skull
through the vertex without intravenous contrast.

[Series 3: head 5.0 h30s · axial · 0.45mm/px · z∈[-103,+52]mm · 8 of 37 slices shown, 10 images]
[im 3/37  brain]
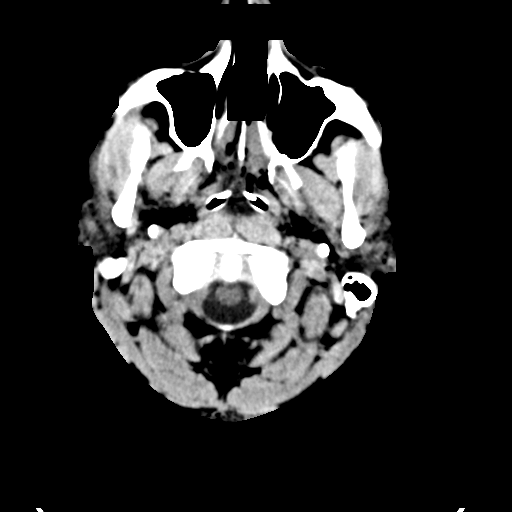
[im 3/37  bone]
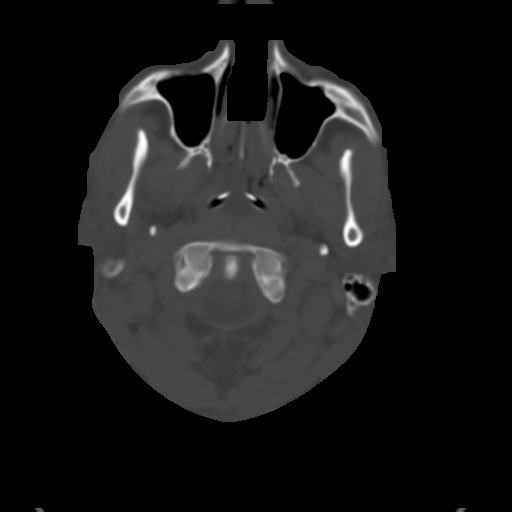
[im 8/37  brain]
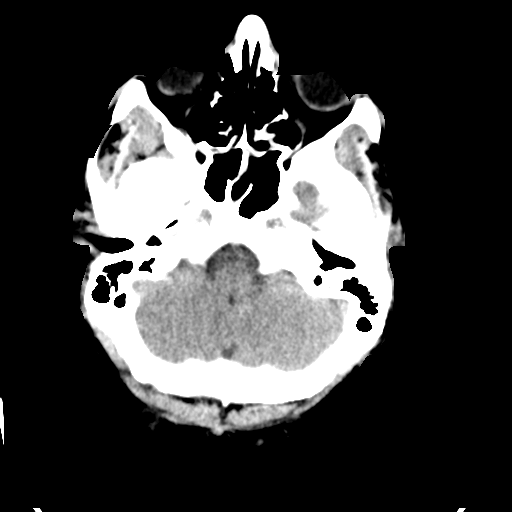
[im 12/37  brain]
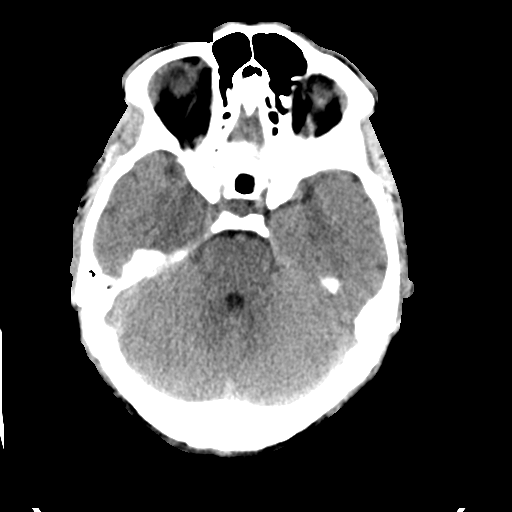
[im 17/37  brain]
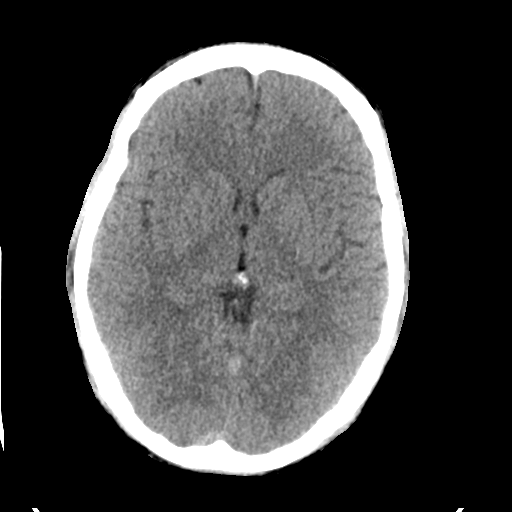
[im 20/37  brain]
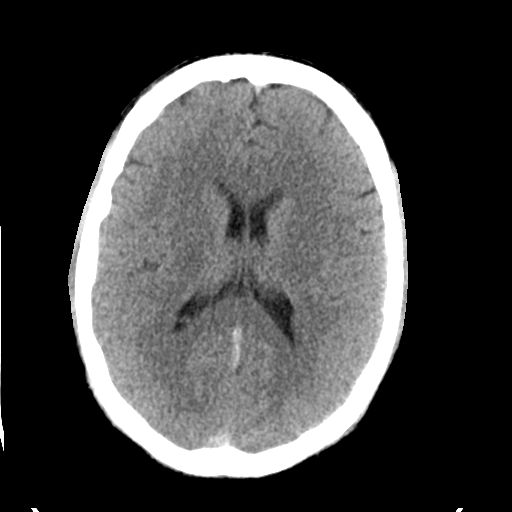
[im 20/37  bone]
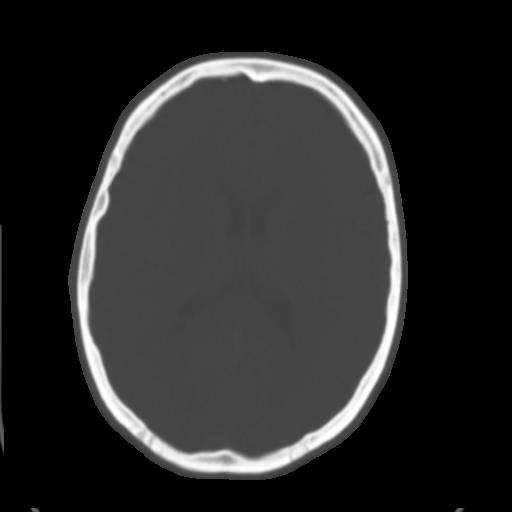
[im 25/37  brain]
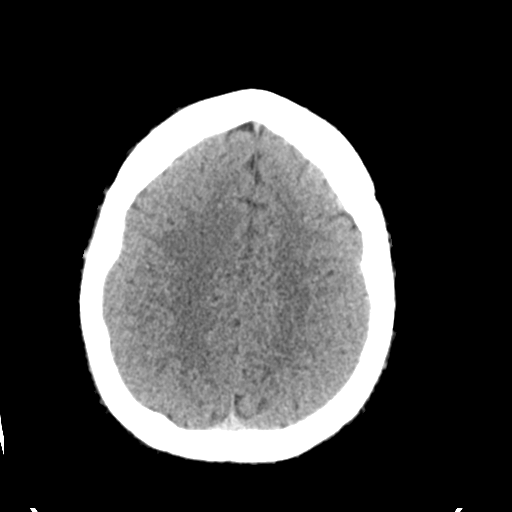
[im 29/37  brain]
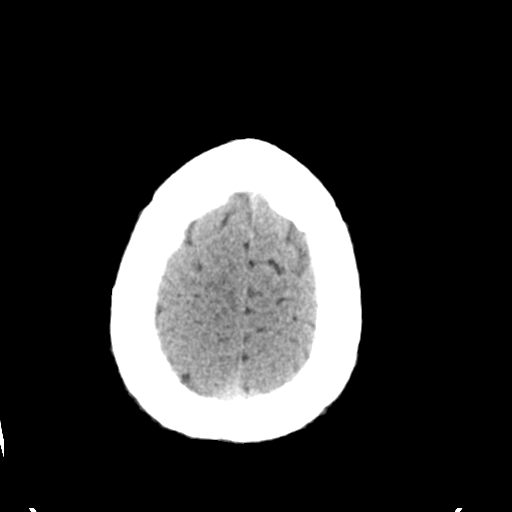
[im 34/37  brain]
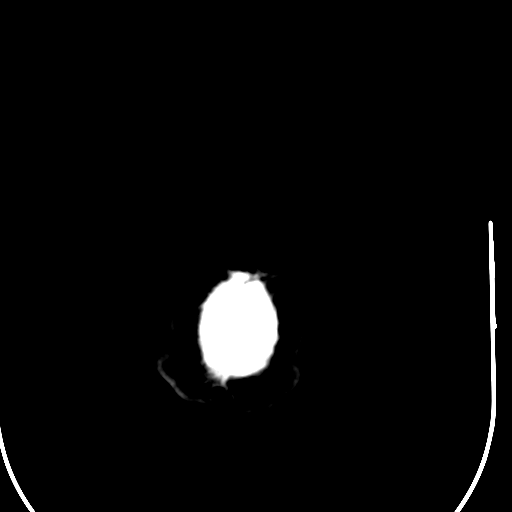

[Series 5: head 3.0 mpr cor · coronal · 0.53mm/px · 3 of 81 slices shown]
[im 27/81  brain]
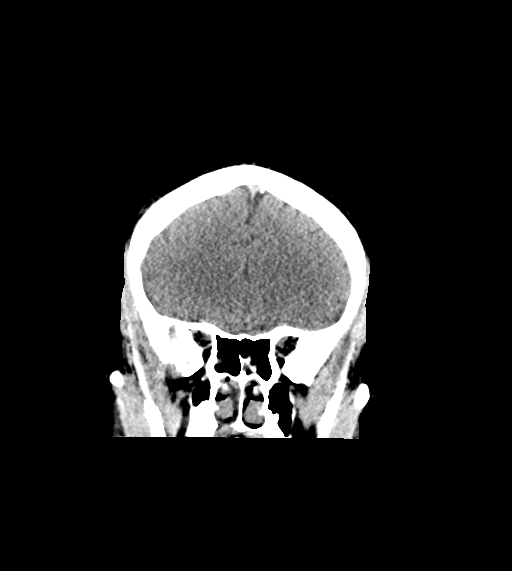
[im 36/81  brain]
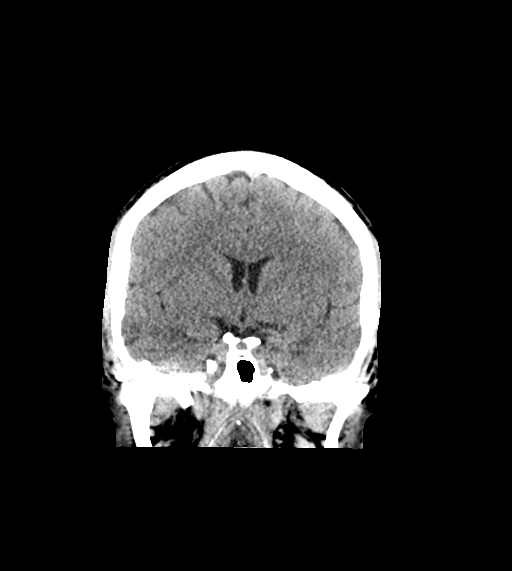
[im 45/81  brain]
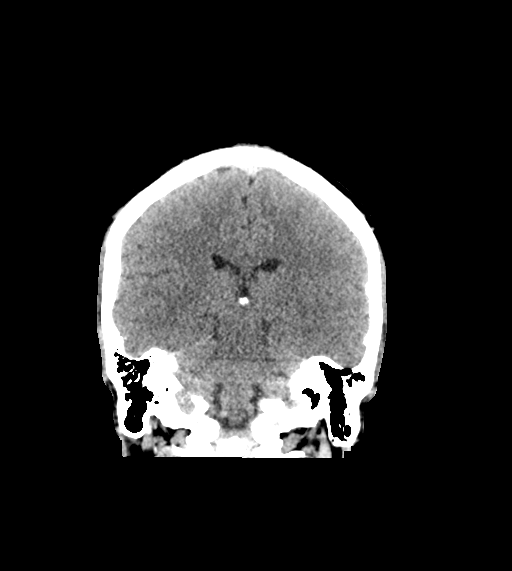

[Series 6: head 3.0 mpr sag · sagittal · 0.35mm/px · 3 of 66 slices shown]
[im 22/66  brain]
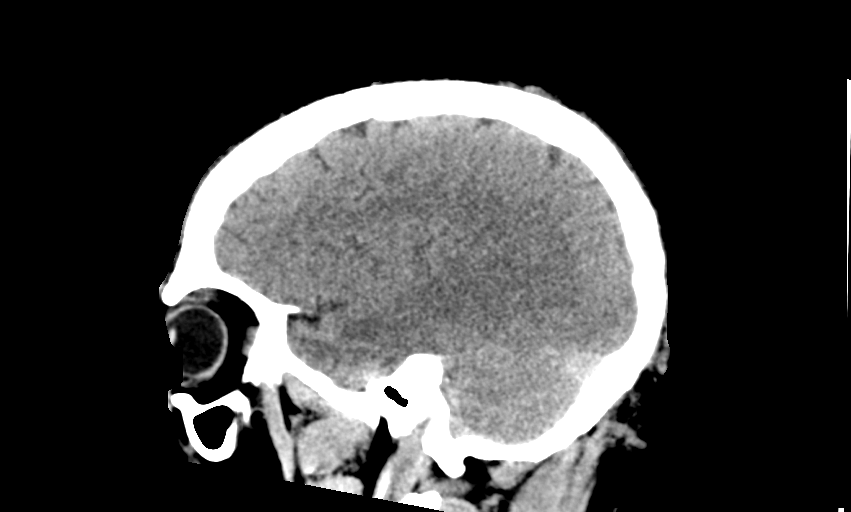
[im 33/66  brain]
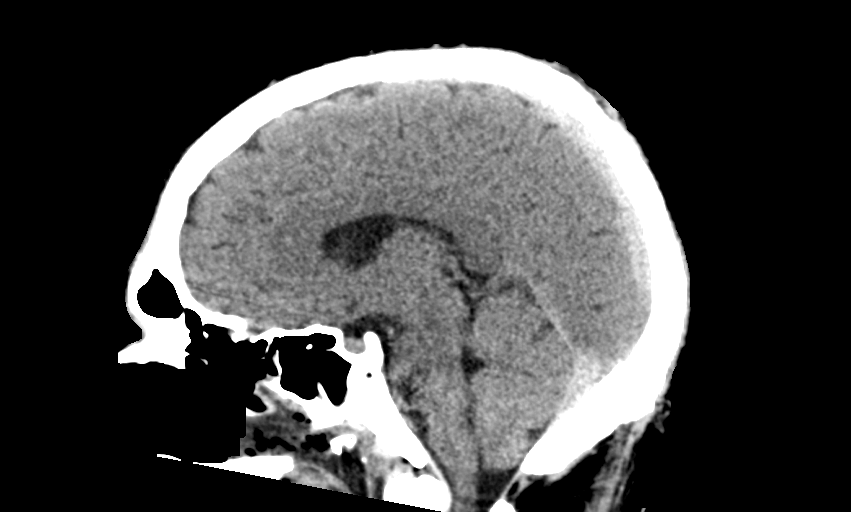
[im 44/66  brain]
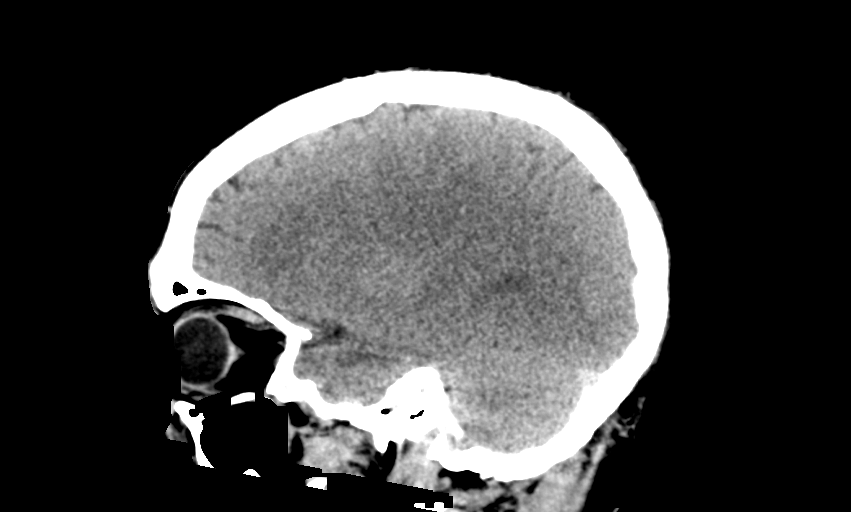

[14 of 47 positions shown; findings below may reference images not displayed]

FINDINGS: Brain: No evidence of acute infarction, hemorrhage, hydrocephalus,
extra-axial collection, visible mass lesion or mass effect.

Vascular: No hyperdense vessel or unexpected calcification.

Skull: No calvarial fracture or suspicious osseous lesion. No scalp
swelling or hematoma.

Sinuses/Orbits: Minimal thickening in the paranasal sinuses.
Additional secretions noted in the posterior nasopharynx. Coiling of
the transesophageal tube in the posterior oropharynx noted
incidentally. Mastoid air cells are predominantly clear. Included
orbital structures are unremarkable.

Other: None
IMPRESSION: 1. No acute intracranial abnormality.
2. Coiling of the transesophageal tube in the posterior oropharynx
noted incidentally with mural thickening in the posterior paranasal
sinuses and secretions in the nasopharynx likely related to this
instrumentation. Consider repositioning.
